# Patient Record
Sex: Male | Born: 1990 | Race: Black or African American | Hispanic: No | Marital: Single | Smoking: Current every day smoker
Health system: Southern US, Community
[De-identification: ages and names within clinical notes are randomized; demographics above are authoritative.]

## PROBLEM LIST (undated history)

## (undated) ENCOUNTER — Emergency Department (HOSPITAL_COMMUNITY): Payer: No Typology Code available for payment source | Source: Home / Self Care

## (undated) DIAGNOSIS — J302 Other seasonal allergic rhinitis: Secondary | ICD-10-CM

## (undated) DIAGNOSIS — W3400XA Accidental discharge from unspecified firearms or gun, initial encounter: Secondary | ICD-10-CM

## (undated) DIAGNOSIS — Z789 Other specified health status: Secondary | ICD-10-CM

## (undated) HISTORY — PX: EXPLORATORY LAPAROTOMY: SUR591

## (undated) HISTORY — PX: ABDOMINAL SURGERY: SHX537

---
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
## 2012-10-29 ENCOUNTER — Emergency Department (HOSPITAL_COMMUNITY): Payer: No Typology Code available for payment source

## 2012-10-29 ENCOUNTER — Encounter (HOSPITAL_COMMUNITY): Payer: Self-pay

## 2012-10-29 ENCOUNTER — Emergency Department (HOSPITAL_COMMUNITY)
Admission: EM | Admit: 2012-10-29 | Discharge: 2012-10-29 | Disposition: A | Discharge status: Home / Self Care | Payer: No Typology Code available for payment source | Attending: Emergency Medicine | Admitting: Emergency Medicine

## 2012-10-29 DIAGNOSIS — Y999 Unspecified external cause status: Secondary | ICD-10-CM | POA: Insufficient documentation

## 2012-10-29 DIAGNOSIS — Y9389 Activity, other specified: Secondary | ICD-10-CM | POA: Insufficient documentation

## 2012-10-29 DIAGNOSIS — S298XXA Other specified injuries of thorax, initial encounter: Secondary | ICD-10-CM | POA: Insufficient documentation

## 2012-10-29 DIAGNOSIS — Z8709 Personal history of other diseases of the respiratory system: Secondary | ICD-10-CM | POA: Insufficient documentation

## 2012-10-29 DIAGNOSIS — Y92410 Unspecified street and highway as the place of occurrence of the external cause: Secondary | ICD-10-CM | POA: Insufficient documentation

## 2012-10-29 DIAGNOSIS — S43014A Anterior dislocation of right humerus, initial encounter: Secondary | ICD-10-CM

## 2012-10-29 DIAGNOSIS — S060X9A Concussion with loss of consciousness of unspecified duration, initial encounter: Secondary | ICD-10-CM

## 2012-10-29 DIAGNOSIS — S43016A Anterior dislocation of unspecified humerus, initial encounter: Secondary | ICD-10-CM | POA: Insufficient documentation

## 2012-10-29 DIAGNOSIS — S0993XA Unspecified injury of face, initial encounter: Secondary | ICD-10-CM | POA: Insufficient documentation

## 2012-10-29 DIAGNOSIS — R404 Transient alteration of awareness: Secondary | ICD-10-CM | POA: Insufficient documentation

## 2012-10-29 HISTORY — DX: Other seasonal allergic rhinitis: J30.2

## 2012-10-29 MED ORDER — HYDROCODONE-ACETAMINOPHEN 5-325 MG PO TABS: 1.0000 | ORAL_TABLET | Freq: Four times a day (QID) | ORAL | Status: DC | PRN

## 2012-10-29 MED ORDER — CYCLOBENZAPRINE HCL 10 MG PO TABS: 10.0000 mg | ORAL_TABLET | Freq: Two times a day (BID) | ORAL | Status: DC | PRN

## 2012-10-29 MED ORDER — IBUPROFEN 600 MG PO TABS: 600.0000 mg | ORAL_TABLET | Freq: Four times a day (QID) | ORAL | Status: DC | PRN

## 2012-10-29 MED ORDER — KETOROLAC TROMETHAMINE 60 MG/2ML IM SOLN
60.0000 mg | Freq: Once | INTRAMUSCULAR | Status: AC
  Administered 2012-10-29: 60 mg via INTRAMUSCULAR
  Filled 2012-10-29: qty 2
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
## 2012-10-29 NOTE — ED Notes (Signed)
  Pt very sleepy and fell asleep during Triage

## 2013-04-13 ENCOUNTER — Emergency Department (HOSPITAL_COMMUNITY): Payer: No Typology Code available for payment source

## 2013-04-13 ENCOUNTER — Encounter (HOSPITAL_COMMUNITY): Payer: Self-pay | Admitting: Emergency Medicine

## 2013-04-13 ENCOUNTER — Emergency Department (HOSPITAL_COMMUNITY)
Admission: EM | Admit: 2013-04-13 | Discharge: 2013-04-13 | Disposition: A | Discharge status: Home / Self Care | Payer: No Typology Code available for payment source | Attending: Emergency Medicine | Admitting: Emergency Medicine

## 2013-04-13 DIAGNOSIS — Y92410 Unspecified street and highway as the place of occurrence of the external cause: Secondary | ICD-10-CM | POA: Insufficient documentation

## 2013-04-13 DIAGNOSIS — S40012S Contusion of left shoulder, sequela: Secondary | ICD-10-CM

## 2013-04-13 DIAGNOSIS — S0993XA Unspecified injury of face, initial encounter: Secondary | ICD-10-CM | POA: Insufficient documentation

## 2013-04-13 DIAGNOSIS — IMO0002 Reserved for concepts with insufficient information to code with codable children: Secondary | ICD-10-CM | POA: Insufficient documentation

## 2013-04-13 DIAGNOSIS — S40019A Contusion of unspecified shoulder, initial encounter: Secondary | ICD-10-CM

## 2013-04-13 DIAGNOSIS — S9002XA Contusion of left ankle, initial encounter: Secondary | ICD-10-CM

## 2013-04-13 DIAGNOSIS — Y9389 Activity, other specified: Secondary | ICD-10-CM | POA: Insufficient documentation

## 2013-04-13 DIAGNOSIS — S9000XA Contusion of unspecified ankle, initial encounter: Secondary | ICD-10-CM | POA: Insufficient documentation

## 2013-04-13 LAB — CBC WITH DIFFERENTIAL/PLATELET
Basophils Absolute: 0 10*3/uL (ref 0.0–0.1)
Basophils Relative: 0 % (ref 0–1)
Eosinophils Absolute: 0 10*3/uL (ref 0.0–0.7)
MCH: 32.3 pg (ref 26.0–34.0)
MCHC: 35.5 g/dL (ref 30.0–36.0)
Neutro Abs: 4 10*3/uL (ref 1.7–7.7)
Neutrophils Relative %: 60 % (ref 43–77)
Platelets: 248 10*3/uL (ref 150–400)
RDW: 14.4 % (ref 11.5–15.5)

## 2013-04-13 LAB — URINALYSIS W MICROSCOPIC + REFLEX CULTURE
Glucose, UA: NEGATIVE mg/dL
Hgb urine dipstick: NEGATIVE
Ketones, ur: 15 mg/dL — AB
Protein, ur: NEGATIVE mg/dL
pH: 6 (ref 5.0–8.0)

## 2013-04-13 LAB — COMPREHENSIVE METABOLIC PANEL
AST: 21 U/L (ref 0–37)
Albumin: 4 g/dL (ref 3.5–5.2)
Alkaline Phosphatase: 59 U/L (ref 39–117)
Chloride: 102 mEq/L (ref 96–112)
Potassium: 3.5 mEq/L (ref 3.5–5.1)
Total Bilirubin: 0.3 mg/dL (ref 0.3–1.2)
Total Protein: 6.9 g/dL (ref 6.0–8.3)

## 2013-04-13 LAB — RAPID URINE DRUG SCREEN, HOSP PERFORMED
Cocaine: POSITIVE — AB
Opiates: NOT DETECTED
Tetrahydrocannabinol: POSITIVE — AB

## 2013-04-13 MED ORDER — HYDROMORPHONE HCL PF 1 MG/ML IJ SOLN
1.0000 mg | Freq: Once | INTRAMUSCULAR | Status: AC
  Administered 2013-04-13: 1 mg via INTRAVENOUS
  Filled 2013-04-13: qty 1

## 2013-04-13 MED ORDER — OXYCODONE-ACETAMINOPHEN 5-325 MG PO TABS: 1.0000 | ORAL_TABLET | Freq: Four times a day (QID) | ORAL | Status: DC | PRN

## 2013-04-13 MED ORDER — HYDROMORPHONE HCL PF 1 MG/ML IJ SOLN
0.5000 mg | Freq: Once | INTRAMUSCULAR | Status: AC
  Administered 2013-04-13: 0.5 mg via INTRAVENOUS
  Filled 2013-04-13: qty 1
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
## 2013-04-13 NOTE — ED Notes (Signed)
Pt returned from CT °
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
## 2013-04-13 NOTE — ED Notes (Signed)
Pt attempting to provide urine sample

## 2013-04-14 LAB — URINE CULTURE
Colony Count: NO GROWTH
Culture: NO GROWTH

## 2013-05-12 ENCOUNTER — Ambulatory Visit: Payer: Self-pay

## 2013-08-04 ENCOUNTER — Encounter (HOSPITAL_COMMUNITY): Payer: Self-pay | Admitting: Emergency Medicine

## 2013-08-04 ENCOUNTER — Emergency Department (HOSPITAL_COMMUNITY)
Admission: EM | Admit: 2013-08-04 | Discharge: 2013-08-04 | Disposition: A | Discharge status: Home / Self Care | Payer: No Typology Code available for payment source | Attending: Emergency Medicine | Admitting: Emergency Medicine

## 2013-08-04 ENCOUNTER — Emergency Department (HOSPITAL_COMMUNITY): Payer: No Typology Code available for payment source

## 2013-08-04 DIAGNOSIS — F17200 Nicotine dependence, unspecified, uncomplicated: Secondary | ICD-10-CM | POA: Insufficient documentation

## 2013-08-04 DIAGNOSIS — R296 Repeated falls: Secondary | ICD-10-CM | POA: Insufficient documentation

## 2013-08-04 DIAGNOSIS — T148XXA Other injury of unspecified body region, initial encounter: Secondary | ICD-10-CM

## 2013-08-04 DIAGNOSIS — Y929 Unspecified place or not applicable: Secondary | ICD-10-CM | POA: Insufficient documentation

## 2013-08-04 DIAGNOSIS — M25461 Effusion, right knee: Secondary | ICD-10-CM

## 2013-08-04 DIAGNOSIS — IMO0002 Reserved for concepts with insufficient information to code with codable children: Secondary | ICD-10-CM | POA: Insufficient documentation

## 2013-08-04 DIAGNOSIS — Y939 Activity, unspecified: Secondary | ICD-10-CM | POA: Insufficient documentation

## 2013-08-04 MED ORDER — OXYCODONE-ACETAMINOPHEN 5-325 MG PO TABS
1.0000 | ORAL_TABLET | Freq: Once | ORAL | Status: AC
  Administered 2013-08-04: 1 via ORAL
  Filled 2013-08-04: qty 1

## 2013-08-04 MED ORDER — MELOXICAM 7.5 MG PO TABS: 7.5000 mg | ORAL_TABLET | Freq: Every day | ORAL | Status: DC

## 2013-08-04 MED ORDER — HYDROCODONE-ACETAMINOPHEN 7.5-325 MG/15ML PO SOLN: 10.0000 mL | Freq: Four times a day (QID) | ORAL | Status: AC | PRN

## 2013-08-04 MED ORDER — KETOROLAC TROMETHAMINE 60 MG/2ML IM SOLN
60.0000 mg | Freq: Once | INTRAMUSCULAR | Status: AC
  Administered 2013-08-04: 60 mg via INTRAMUSCULAR
  Filled 2013-08-04: qty 2

## 2013-08-04 NOTE — Discharge Instructions (Signed)
Abrasions  An abrasion is a cut or scrape of the skin. Abrasions do not go through all layers of the skin.  HOME CARE  · If a bandage (dressing) was put on your wound, change it as told by your doctor. If the bandage sticks, soak it off with warm.  · Wash the area with water and soap 2 times a day. Rinse off the soap. Pat the area dry with a clean towel.  · Put on medicated cream (ointment) as told by your doctor.  · Change your bandage right away if it gets wet or dirty.  · Only take medicine as told by your doctor.  · See your doctor within 24 48 hours to get your wound checked.  · Check your wound for redness, puffiness (swelling), or yellowish-white fluid (pus).  GET HELP RIGHT AWAY IF:   · You have more pain in the wound.  · You have redness, swelling, or tenderness around the wound.  · You have pus coming from the wound.  · You have a fever or lasting symptoms for more than 2 3 days.  · You have a fever and your symptoms suddenly get worse.  · You have a bad smell coming from the wound or bandage.  MAKE SURE YOU:   · Understand these instructions.  · Will watch your condition.  · Will get help right away if you are not doing well or get worse.  Document Released: 10/18/2007 Document Revised: 01/24/2012 Document Reviewed: 04/04/2011  [employer]® Patient Information ©2014 [employer].

## 2013-08-04 NOTE — ED Notes (Signed)
Pt is being discharged via wheel chair. Pt has a friend who will drive him home. VS stable.
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
## 2013-10-29 ENCOUNTER — Emergency Department (HOSPITAL_COMMUNITY)
Admission: EM | Admit: 2013-10-29 | Discharge: 2013-10-30 | Disposition: A | Discharge status: Home / Self Care | Payer: No Typology Code available for payment source | Attending: Emergency Medicine | Admitting: Emergency Medicine

## 2013-10-29 ENCOUNTER — Encounter (HOSPITAL_COMMUNITY): Payer: Self-pay | Admitting: Emergency Medicine

## 2013-10-29 DIAGNOSIS — R112 Nausea with vomiting, unspecified: Secondary | ICD-10-CM | POA: Insufficient documentation

## 2013-10-29 DIAGNOSIS — F17200 Nicotine dependence, unspecified, uncomplicated: Secondary | ICD-10-CM | POA: Insufficient documentation

## 2013-10-29 DIAGNOSIS — R51 Headache: Secondary | ICD-10-CM | POA: Insufficient documentation

## 2013-10-29 DIAGNOSIS — Z791 Long term (current) use of non-steroidal anti-inflammatories (NSAID): Secondary | ICD-10-CM | POA: Insufficient documentation

## 2013-10-29 DIAGNOSIS — R519 Headache, unspecified: Secondary | ICD-10-CM

## 2013-10-29 DIAGNOSIS — R509 Fever, unspecified: Secondary | ICD-10-CM | POA: Insufficient documentation

## 2013-10-29 HISTORY — DX: Accidental discharge from unspecified firearms or gun, initial encounter: W34.00XA

## 2013-10-29 LAB — CBC WITH DIFFERENTIAL/PLATELET
Basophils Absolute: 0 10*3/uL (ref 0.0–0.1)
Basophils Relative: 1 % (ref 0–1)
EOS ABS: 0.1 10*3/uL (ref 0.0–0.7)
Eosinophils Relative: 1 % (ref 0–5)
HCT: 42.6 % (ref 39.0–52.0)
HEMOGLOBIN: 14.7 g/dL (ref 13.0–17.0)
LYMPHS PCT: 28 % (ref 12–46)
Lymphs Abs: 2 10*3/uL (ref 0.7–4.0)
MCH: 32.1 pg (ref 26.0–34.0)
MCHC: 34.5 g/dL (ref 30.0–36.0)
MCV: 93 fL (ref 78.0–100.0)
MONOS PCT: 11 % (ref 3–12)
Monocytes Absolute: 0.8 10*3/uL (ref 0.1–1.0)
NEUTROS PCT: 59 % (ref 43–77)
Neutro Abs: 4.2 10*3/uL (ref 1.7–7.7)
Platelets: 273 10*3/uL (ref 150–400)
RBC: 4.58 MIL/uL (ref 4.22–5.81)
RDW: 14 % (ref 11.5–15.5)
WBC: 7.1 10*3/uL (ref 4.0–10.5)

## 2013-10-29 LAB — COMPREHENSIVE METABOLIC PANEL
ALK PHOS: 74 U/L (ref 39–117)
ALT: 7 U/L (ref 0–53)
AST: 23 U/L (ref 0–37)
Albumin: 3.8 g/dL (ref 3.5–5.2)
BILIRUBIN TOTAL: 0.3 mg/dL (ref 0.3–1.2)
BUN: 8 mg/dL (ref 6–23)
CALCIUM: 9.4 mg/dL (ref 8.4–10.5)
CO2: 28 meq/L (ref 19–32)
Chloride: 99 mEq/L (ref 96–112)
Creatinine, Ser: 0.99 mg/dL (ref 0.50–1.35)
GLUCOSE: 109 mg/dL — AB (ref 70–99)
Potassium: 4.2 mEq/L (ref 3.7–5.3)
Sodium: 138 mEq/L (ref 137–147)
Total Protein: 7.2 g/dL (ref 6.0–8.3)

## 2013-10-29 LAB — I-STAT CG4 LACTIC ACID, ED: Lactic Acid, Venous: 0.87 mmol/L (ref 0.5–2.2)

## 2013-10-29 MED ORDER — PROMETHAZINE HCL 25 MG PO TABS: 25.0000 mg | ORAL_TABLET | Freq: Four times a day (QID) | ORAL | Status: AC | PRN

## 2013-10-29 MED ORDER — ONDANSETRON HCL 4 MG/2ML IJ SOLN
4.0000 mg | Freq: Once | INTRAMUSCULAR | Status: AC
Start: 2013-10-29 — End: 2013-10-29
  Administered 2013-10-29: 4 mg via INTRAVENOUS
  Filled 2013-10-29: qty 2

## 2013-10-29 MED ORDER — SODIUM CHLORIDE 0.9 % IV BOLUS (SEPSIS)
1000.0000 mL | Freq: Once | INTRAVENOUS | Status: AC
  Administered 2013-10-29: 1000 mL via INTRAVENOUS

## 2013-10-29 MED ORDER — MORPHINE SULFATE 4 MG/ML IJ SOLN
4.0000 mg | Freq: Once | INTRAMUSCULAR | Status: AC
  Administered 2013-10-29: 4 mg via INTRAVENOUS
  Filled 2013-10-29: qty 1

## 2013-10-29 MED ORDER — KETOROLAC TROMETHAMINE 30 MG/ML IJ SOLN
30.0000 mg | Freq: Once | INTRAMUSCULAR | Status: AC
Start: 2013-10-29 — End: 2013-10-29
  Administered 2013-10-29: 30 mg via INTRAVENOUS
  Filled 2013-10-29: qty 1

## 2013-10-29 NOTE — ED Notes (Signed)
Pt c/o frontal headache onset last week worse since yesterday, +n/v.  No relief with ibuprofen/APAP. Denies nuchal rigidity

## 2013-10-29 NOTE — ED Notes (Signed)
Pt noted to be falling asleep between questions in triage. Pt denies any intake that would cause drowsiness.
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
## 2013-10-29 NOTE — Discharge Instructions (Signed)
General Headache Without Cause A headache is pain or discomfort felt around the head or neck area. The specific cause of a headache may not be found. There are many causes and types of headaches. A few common ones are:  Tension headaches.  Migraine headaches.  Cluster headaches.  Chronic daily headaches. HOME CARE INSTRUCTIONS   Keep all follow-up appointments with your caregiver or any specialist referral.  Only take over-the-counter or prescription medicines for pain or discomfort as directed by your caregiver.  Lie down in a dark, quiet room when you have a headache.  Keep a headache journal to find out what may trigger your migraine headaches. For example, write down:  What you eat and drink.  How much sleep you get.  Any change to your diet or medicines.  Try massage or other relaxation techniques.  Put ice packs or heat on the head and neck. Use these 3 to 4 times per day for 15 to 20 minutes each time, or as needed.  Limit stress.  Sit up straight, and do not tense your muscles.  Quit smoking if you smoke.  Limit alcohol use.  Decrease the amount of caffeine you drink, or stop drinking caffeine.  Eat and sleep on a regular schedule.  Get 7 to 9 hours of sleep, or as recommended by your caregiver.  Keep lights dim if bright lights bother you and make your headaches worse. SEEK MEDICAL CARE IF:   You have problems with the medicines you were prescribed.  Your medicines are not working.  You have a change from the usual headache.  You have nausea or vomiting. SEEK IMMEDIATE MEDICAL CARE IF:   Your headache becomes severe.  You have a fever.  You have a stiff neck.  You have loss of vision.  You have muscular weakness or loss of muscle control.  You start losing your balance or have trouble walking.  You feel faint or pass out.  You have severe symptoms that are different from your first symptoms. MAKE SURE YOU:   Understand these  instructions.  Will watch your condition.  Will get help right away if you are not doing well or get worse. Document Released: 05/01/2005 Document Revised: 07/24/2011 Document Reviewed: 05/17/2011 [facility] Patient Information 2015 [place], [place]. This information is not intended to replace advice given to you by your health care provider. Make sure you discuss any questions you have with your health care provider. Viral Infections A viral infection can be caused by different types of viruses.Most viral infections are not serious and resolve on their own. However, some infections may cause severe symptoms and may lead to further complications. SYMPTOMS Viruses can frequently cause:  Minor sore throat.  Aches and pains.  Headaches.  Runny nose.  Different types of rashes.  Watery eyes.  Tiredness.  Cough.  Loss of appetite.  Gastrointestinal infections, resulting in nausea, vomiting, and diarrhea. These symptoms do not respond to antibiotics because the infection is not caused by bacteria. However, you might catch a bacterial infection following the viral infection. This is sometimes called a "superinfection." Symptoms of such a bacterial infection may include:  Worsening sore throat with pus and difficulty swallowing.  Swollen neck glands.  Chills and a high or persistent fever.  Severe headache.  Tenderness over the sinuses.  Persistent overall ill feeling (malaise), muscle aches, and tiredness (fatigue).  Persistent cough.  Yellow, green, or brown mucus production with coughing. HOME CARE INSTRUCTIONS   Only take over-the-counter or prescription  medicines for pain, discomfort, diarrhea, or fever as directed by your caregiver.  Drink enough water and fluids to keep your urine clear or pale yellow. Sports drinks can provide valuable electrolytes, sugars, and hydration.  Get plenty of rest and maintain proper nutrition. Soups and broths with crackers or rice are  fine. SEEK IMMEDIATE MEDICAL CARE IF:   You have severe headaches, shortness of breath, chest pain, neck pain, or an unusual rash.  You have uncontrolled vomiting, diarrhea, or you are unable to keep down fluids.  You or your child has an oral temperature above 102 F (38.9 C), not controlled by medicine.  Your baby is older than 3 months with a rectal temperature of 102 F (38.9 C) or higher.  Your baby is 253 months old or younger with a rectal temperature of 100.4 F (38 C) or higher. MAKE SURE YOU:   Understand these instructions.  Will watch your condition.  Will get help right away if you are not doing well or get worse. Document Released: 02/08/2005 Document Revised: 07/24/2011 Document Reviewed: 09/05/2010 [facility] Patient Information 2015 [place], [place]. This information is not intended to replace advice given to you by your health care provider. Make sure you discuss any questions you have with your health care provider.

## 2015-04-05 ENCOUNTER — Inpatient Hospital Stay (HOSPITAL_COMMUNITY)
Admission: EM | Admit: 2015-04-05 | Discharge: 2015-04-13 | DRG: 956 | Disposition: A | Discharge status: Home Health | Payer: Self-pay | Attending: General Surgery | Admitting: General Surgery

## 2015-04-05 ENCOUNTER — Emergency Department (HOSPITAL_COMMUNITY): Payer: MEDICAID

## 2015-04-05 ENCOUNTER — Emergency Department (HOSPITAL_COMMUNITY): Payer: Self-pay

## 2015-04-05 ENCOUNTER — Emergency Department (HOSPITAL_COMMUNITY): Payer: Self-pay | Admitting: Certified Registered Nurse Anesthetist

## 2015-04-05 ENCOUNTER — Encounter (HOSPITAL_COMMUNITY): Admission: EM | Disposition: A | Discharge status: Home Health | Payer: Self-pay | Source: Home / Self Care

## 2015-04-05 ENCOUNTER — Emergency Department (HOSPITAL_COMMUNITY): Payer: MEDICAID | Admitting: Certified Registered Nurse Anesthetist

## 2015-04-05 DIAGNOSIS — S7291XB Unspecified fracture of right femur, initial encounter for open fracture type I or II: Secondary | ICD-10-CM | POA: Diagnosis present

## 2015-04-05 DIAGNOSIS — S20359A Superficial foreign body of unspecified front wall of thorax, initial encounter: Secondary | ICD-10-CM | POA: Diagnosis present

## 2015-04-05 DIAGNOSIS — T148XXA Other injury of unspecified body region, initial encounter: Secondary | ICD-10-CM

## 2015-04-05 DIAGNOSIS — S7291XA Unspecified fracture of right femur, initial encounter for closed fracture: Secondary | ICD-10-CM | POA: Diagnosis present

## 2015-04-05 DIAGNOSIS — S27321A Contusion of lung, unilateral, initial encounter: Secondary | ICD-10-CM | POA: Diagnosis present

## 2015-04-05 DIAGNOSIS — R339 Retention of urine, unspecified: Secondary | ICD-10-CM | POA: Diagnosis not present

## 2015-04-05 DIAGNOSIS — T1490XA Injury, unspecified, initial encounter: Secondary | ICD-10-CM

## 2015-04-05 DIAGNOSIS — W3400XA Accidental discharge from unspecified firearms or gun, initial encounter: Secondary | ICD-10-CM

## 2015-04-05 DIAGNOSIS — I959 Hypotension, unspecified: Secondary | ICD-10-CM | POA: Diagnosis present

## 2015-04-05 DIAGNOSIS — Z938 Other artificial opening status: Secondary | ICD-10-CM

## 2015-04-05 DIAGNOSIS — S45111A Laceration of brachial artery, right side, initial encounter: Secondary | ICD-10-CM | POA: Diagnosis present

## 2015-04-05 DIAGNOSIS — F17200 Nicotine dependence, unspecified, uncomplicated: Secondary | ICD-10-CM | POA: Diagnosis present

## 2015-04-05 DIAGNOSIS — S42301B Unspecified fracture of shaft of humerus, right arm, initial encounter for open fracture: Secondary | ICD-10-CM | POA: Diagnosis present

## 2015-04-05 DIAGNOSIS — Z4659 Encounter for fitting and adjustment of other gastrointestinal appliance and device: Secondary | ICD-10-CM

## 2015-04-05 DIAGNOSIS — Z23 Encounter for immunization: Secondary | ICD-10-CM

## 2015-04-05 DIAGNOSIS — S45191A Other specified injury of brachial artery, right side, initial encounter: Secondary | ICD-10-CM

## 2015-04-05 DIAGNOSIS — T07XXXA Unspecified multiple injuries, initial encounter: Secondary | ICD-10-CM

## 2015-04-05 DIAGNOSIS — S4421XA Injury of radial nerve at upper arm level, right arm, initial encounter: Secondary | ICD-10-CM | POA: Diagnosis present

## 2015-04-05 DIAGNOSIS — Z9689 Presence of other specified functional implants: Secondary | ICD-10-CM

## 2015-04-05 DIAGNOSIS — S42301A Unspecified fracture of shaft of humerus, right arm, initial encounter for closed fracture: Secondary | ICD-10-CM | POA: Diagnosis present

## 2015-04-05 DIAGNOSIS — J9600 Acute respiratory failure, unspecified whether with hypoxia or hypercapnia: Secondary | ICD-10-CM | POA: Diagnosis present

## 2015-04-05 DIAGNOSIS — D62 Acute posthemorrhagic anemia: Secondary | ICD-10-CM | POA: Diagnosis present

## 2015-04-05 DIAGNOSIS — J9382 Other air leak: Secondary | ICD-10-CM | POA: Diagnosis not present

## 2015-04-05 DIAGNOSIS — S2241XA Multiple fractures of ribs, right side, initial encounter for closed fracture: Secondary | ICD-10-CM | POA: Diagnosis present

## 2015-04-05 DIAGNOSIS — S272XXA Traumatic hemopneumothorax, initial encounter: Principal | ICD-10-CM | POA: Diagnosis present

## 2015-04-05 DIAGNOSIS — S2249XA Multiple fractures of ribs, unspecified side, initial encounter for closed fracture: Secondary | ICD-10-CM | POA: Diagnosis present

## 2015-04-05 DIAGNOSIS — S5411XA Injury of median nerve at forearm level, right arm, initial encounter: Secondary | ICD-10-CM | POA: Diagnosis present

## 2015-04-05 DIAGNOSIS — J939 Pneumothorax, unspecified: Secondary | ICD-10-CM

## 2015-04-05 DIAGNOSIS — S5421XA Injury of radial nerve at forearm level, right arm, initial encounter: Secondary | ICD-10-CM | POA: Diagnosis present

## 2015-04-05 DIAGNOSIS — S45101A Unspecified injury of brachial artery, right side, initial encounter: Secondary | ICD-10-CM | POA: Diagnosis present

## 2015-04-05 DIAGNOSIS — J9690 Respiratory failure, unspecified, unspecified whether with hypoxia or hypercapnia: Secondary | ICD-10-CM

## 2015-04-05 DIAGNOSIS — J9601 Acute respiratory failure with hypoxia: Secondary | ICD-10-CM | POA: Diagnosis not present

## 2015-04-05 DIAGNOSIS — Z789 Other specified health status: Secondary | ICD-10-CM

## 2015-04-05 HISTORY — PX: WOUND EXPLORATION: SHX6188

## 2015-04-05 HISTORY — DX: Other specified health status: Z78.9

## 2015-04-05 HISTORY — PX: FEMUR IM NAIL: SHX1597

## 2015-04-05 LAB — COMPREHENSIVE METABOLIC PANEL
ALBUMIN: 3.9 g/dL (ref 3.5–5.0)
ALK PHOS: 60 U/L (ref 38–126)
ALT: 12 U/L — ABNORMAL LOW (ref 17–63)
AST: 34 U/L (ref 15–41)
Anion gap: 13 (ref 5–15)
BILIRUBIN TOTAL: 0.7 mg/dL (ref 0.3–1.2)
BUN: 13 mg/dL (ref 6–20)
CALCIUM: 9.1 mg/dL (ref 8.9–10.3)
CO2: 20 mmol/L — ABNORMAL LOW (ref 22–32)
Chloride: 105 mmol/L (ref 101–111)
Creatinine, Ser: 1.4 mg/dL — ABNORMAL HIGH (ref 0.61–1.24)
GFR calc Af Amer: >60 mL/min (ref >60)
GLUCOSE: 120 mg/dL — AB (ref 65–99)
Potassium: 3.7 mmol/L (ref 3.5–5.1)
Sodium: 138 mmol/L (ref 135–145)
TOTAL PROTEIN: 6.5 g/dL (ref 6.5–8.1)

## 2015-04-05 LAB — CBC
HCT: 39.6 % (ref 39.0–52.0)
Hemoglobin: 13.6 g/dL (ref 13.0–17.0)
MCH: 31.6 pg (ref 26.0–34.0)
MCHC: 34.3 g/dL (ref 30.0–36.0)
MCV: 92.1 fL (ref 78.0–100.0)
PLATELETS: 238 10*3/uL (ref 150–400)
RBC: 4.3 MIL/uL (ref 4.22–5.81)
RDW: 14.2 % (ref 11.5–15.5)
WBC: 9.7 10*3/uL (ref 4.0–10.5)

## 2015-04-05 LAB — PREPARE FRESH FROZEN PLASMA
Unit division: 0
Unit division: 0

## 2015-04-05 LAB — ETHANOL

## 2015-04-05 LAB — PROTIME-INR
INR: 1.12 (ref 0.00–1.49)
PROTHROMBIN TIME: 14.6 s (ref 11.6–15.2)

## 2015-04-05 LAB — CDS SEROLOGY

## 2015-04-05 LAB — ABO/RH: ABO/RH(D): O POS

## 2015-04-05 SURGERY — WOUND EXPLORATION
Anesthesia: General | Laterality: Right

## 2015-04-05 MED ORDER — FENTANYL CITRATE (PF) 250 MCG/5ML IJ SOLN
INTRAMUSCULAR | Status: AC
  Filled 2015-04-05: qty 5

## 2015-04-05 MED ORDER — MIDAZOLAM HCL 5 MG/5ML IJ SOLN
INTRAMUSCULAR | Status: DC | PRN
  Administered 2015-04-05: 2 mg via INTRAVENOUS

## 2015-04-05 MED ORDER — LIDOCAINE HCL (PF) 1 % IJ SOLN
INTRAMUSCULAR | Status: AC
  Filled 2015-04-05: qty 30

## 2015-04-05 MED ORDER — ALBUMIN HUMAN 5 % IV SOLN
INTRAVENOUS | Status: DC | PRN
  Administered 2015-04-05: 23:00:00 via INTRAVENOUS

## 2015-04-05 MED ORDER — PHENYLEPHRINE 40 MCG/ML (10ML) SYRINGE FOR IV PUSH (FOR BLOOD PRESSURE SUPPORT)
PREFILLED_SYRINGE | INTRAVENOUS | Status: AC
  Filled 2015-04-05: qty 30

## 2015-04-05 MED ORDER — PHENYLEPHRINE HCL 10 MG/ML IJ SOLN
10.0000 mg | INTRAVENOUS | Status: DC | PRN
  Administered 2015-04-05: 30 ug/min via INTRAVENOUS

## 2015-04-05 MED ORDER — 0.9 % SODIUM CHLORIDE (POUR BTL) OPTIME
TOPICAL | Status: DC | PRN
  Administered 2015-04-05: 1000 mL

## 2015-04-05 MED ORDER — SUCCINYLCHOLINE CHLORIDE 20 MG/ML IJ SOLN
INTRAMUSCULAR | Status: AC
  Filled 2015-04-05: qty 1

## 2015-04-05 MED ORDER — ROCURONIUM BROMIDE 100 MG/10ML IV SOLN
INTRAVENOUS | Status: DC | PRN
  Administered 2015-04-05: 30 mg via INTRAVENOUS
  Administered 2015-04-05 (×2): 25 mg via INTRAVENOUS
  Administered 2015-04-05: 15 mg via INTRAVENOUS
  Administered 2015-04-05: 35 mg via INTRAVENOUS
  Administered 2015-04-06: 20 mg via INTRAVENOUS

## 2015-04-05 MED ORDER — DEXTROSE 50 % IV SOLN
INTRAVENOUS | Status: AC
  Filled 2015-04-05: qty 50

## 2015-04-05 MED ORDER — THROMBIN 20000 UNITS EX SOLR
CUTANEOUS | Status: AC
  Filled 2015-04-05: qty 20000

## 2015-04-05 MED ORDER — FENTANYL CITRATE (PF) 100 MCG/2ML IJ SOLN
INTRAMUSCULAR | Status: AC | PRN
  Administered 2015-04-05: 50 ug via INTRAVENOUS

## 2015-04-05 MED ORDER — SODIUM CHLORIDE 0.9 % IJ SOLN
INTRAMUSCULAR | Status: DC | PRN
  Administered 2015-04-05: 60 mL

## 2015-04-05 MED ORDER — ONDANSETRON HCL 4 MG/2ML IJ SOLN
INTRAMUSCULAR | Status: AC
  Filled 2015-04-05: qty 2

## 2015-04-05 MED ORDER — CEFAZOLIN SODIUM-DEXTROSE 2-3 GM-% IV SOLR
INTRAVENOUS | Status: AC
  Filled 2015-04-05: qty 50

## 2015-04-05 MED ORDER — FENTANYL CITRATE (PF) 100 MCG/2ML IJ SOLN
INTRAMUSCULAR | Status: DC | PRN
  Administered 2015-04-05: 50 ug via INTRAVENOUS
  Administered 2015-04-05: 25 ug via INTRAVENOUS
  Administered 2015-04-05: 50 ug via INTRAVENOUS
  Administered 2015-04-05: 150 ug via INTRAVENOUS
  Administered 2015-04-05: 25 ug via INTRAVENOUS
  Administered 2015-04-06 (×4): 50 ug via INTRAVENOUS

## 2015-04-05 MED ORDER — TETANUS-DIPHTH-ACELL PERTUSSIS 5-2.5-18.5 LF-MCG/0.5 IM SUSP
INTRAMUSCULAR | Status: AC
  Administered 2015-04-05: 0.5 mL via INTRAMUSCULAR
  Filled 2015-04-05: qty 0.5

## 2015-04-05 MED ORDER — PHENYLEPHRINE HCL 10 MG/ML IJ SOLN
INTRAMUSCULAR | Status: DC | PRN
  Administered 2015-04-05: 120 ug via INTRAVENOUS
  Administered 2015-04-05: 80 ug via INTRAVENOUS
  Administered 2015-04-05: 40 ug via INTRAVENOUS
  Administered 2015-04-05 (×2): 80 ug via INTRAVENOUS
  Administered 2015-04-05: 120 ug via INTRAVENOUS
  Administered 2015-04-05: 80 ug via INTRAVENOUS

## 2015-04-05 MED ORDER — INSULIN ASPART 100 UNIT/ML ~~LOC~~ SOLN
5.0000 [IU] | Freq: Once | SUBCUTANEOUS | Status: AC
  Administered 2015-04-05: 5 [IU] via SUBCUTANEOUS
  Filled 2015-04-05: qty 0.05

## 2015-04-05 MED ORDER — ALBUMIN HUMAN 5 % IV SOLN: INTRAVENOUS | Status: DC | PRN

## 2015-04-05 MED ORDER — PAPAVERINE HCL 30 MG/ML IJ SOLN
INTRAMUSCULAR | Status: AC
  Filled 2015-04-05: qty 2

## 2015-04-05 MED ORDER — LIDOCAINE HCL (PF) 2 % IJ SOLN
INTRAMUSCULAR | Status: AC
  Administered 2015-04-05: 19:00:00
  Filled 2015-04-05: qty 2

## 2015-04-05 MED ORDER — ROCURONIUM BROMIDE 50 MG/5ML IV SOLN
INTRAVENOUS | Status: AC
  Filled 2015-04-05: qty 1

## 2015-04-05 MED ORDER — PROPOFOL 10 MG/ML IV BOLUS
INTRAVENOUS | Status: DC | PRN
  Administered 2015-04-05: 150 mg via INTRAVENOUS

## 2015-04-05 MED ORDER — IOHEXOL 350 MG/ML SOLN
100.0000 mL | Freq: Once | INTRAVENOUS | Status: AC | PRN
  Administered 2015-04-05: 100 mL via INTRAVENOUS

## 2015-04-05 MED ORDER — SODIUM CHLORIDE 0.9 % IV SOLN
INTRAVENOUS | Status: DC | PRN
  Administered 2015-04-05 – 2015-04-06 (×3): via INTRAVENOUS

## 2015-04-05 MED ORDER — SUCCINYLCHOLINE CHLORIDE 20 MG/ML IJ SOLN
INTRAMUSCULAR | Status: DC | PRN
  Administered 2015-04-05: 100 mg via INTRAVENOUS

## 2015-04-05 MED ORDER — THROMBIN 20000 UNITS EX SOLR
CUTANEOUS | Status: DC | PRN
  Administered 2015-04-05: 20 mL via TOPICAL

## 2015-04-05 MED ORDER — SODIUM CHLORIDE 0.9 % IV SOLN
INTRAVENOUS | Status: DC | PRN
  Administered 2015-04-05: 500 mL

## 2015-04-05 MED ORDER — SODIUM CHLORIDE 0.9 % IJ SOLN
INTRAMUSCULAR | Status: AC
  Filled 2015-04-05: qty 10

## 2015-04-05 MED ORDER — FENTANYL CITRATE (PF) 100 MCG/2ML IJ SOLN
INTRAMUSCULAR | Status: AC
  Filled 2015-04-05: qty 2

## 2015-04-05 MED ORDER — PHENYLEPHRINE 40 MCG/ML (10ML) SYRINGE FOR IV PUSH (FOR BLOOD PRESSURE SUPPORT)
PREFILLED_SYRINGE | INTRAVENOUS | Status: AC
  Filled 2015-04-05: qty 10

## 2015-04-05 MED ORDER — PROPOFOL 10 MG/ML IV BOLUS
INTRAVENOUS | Status: AC
  Filled 2015-04-05: qty 40

## 2015-04-05 MED ORDER — ARTIFICIAL TEARS OP OINT
TOPICAL_OINTMENT | OPHTHALMIC | Status: AC
  Filled 2015-04-05: qty 3.5

## 2015-04-05 MED ORDER — MIDAZOLAM HCL 2 MG/2ML IJ SOLN
INTRAMUSCULAR | Status: AC
  Filled 2015-04-05: qty 2

## 2015-04-05 MED ORDER — DEXTROSE 50 % IV SOLN
INTRAVENOUS | Status: DC | PRN
  Administered 2015-04-05: 12.5 g via INTRAVENOUS

## 2015-04-05 MED ORDER — CEFAZOLIN SODIUM-DEXTROSE 2-3 GM-% IV SOLR
INTRAVENOUS | Status: DC | PRN
  Administered 2015-04-05 – 2015-04-06 (×2): 2 g via INTRAVENOUS

## 2015-04-05 MED ORDER — SODIUM CHLORIDE 0.9 % IV SOLN
INTRAVENOUS | Status: AC | PRN
  Administered 2015-04-05: 3000 mL via INTRAVENOUS

## 2015-04-05 MED ORDER — EPHEDRINE SULFATE 50 MG/ML IJ SOLN
INTRAMUSCULAR | Status: AC
  Filled 2015-04-05: qty 1

## 2015-04-05 MED ORDER — LACTATED RINGERS IV SOLN
INTRAVENOUS | Status: DC | PRN
  Administered 2015-04-05 – 2015-04-06 (×6): via INTRAVENOUS

## 2015-04-05 MED ORDER — LIDOCAINE HCL (CARDIAC) 20 MG/ML IV SOLN
INTRAVENOUS | Status: AC
  Filled 2015-04-05: qty 5

## 2015-04-05 SURGICAL SUPPLY — 97 items
BANDAGE ELASTIC 4 VELCRO ST LF (GAUZE/BANDAGES/DRESSINGS) ×1 IMPLANT
BANDAGE ELASTIC 6 VELCRO ST LF (GAUZE/BANDAGES/DRESSINGS) ×1 IMPLANT
BANDAGE ESMARK 6X9 LF (GAUZE/BANDAGES/DRESSINGS) IMPLANT
BIT DRILL LONG 4.0 (BIT) IMPLANT
BIT DRILL SHORT 4.0 (BIT) IMPLANT
BLADE SURG 15 STRL LF DISP TIS (BLADE) IMPLANT
BLADE SURG 15 STRL SS (BLADE) ×3
BNDG CMPR 9X6 STRL LF SNTH (GAUZE/BANDAGES/DRESSINGS)
BNDG COHESIVE 4X5 TAN STRL (GAUZE/BANDAGES/DRESSINGS) ×3 IMPLANT
BNDG ESMARK 6X9 LF (GAUZE/BANDAGES/DRESSINGS)
BNDG GAUZE ELAST 4 BULKY (GAUZE/BANDAGES/DRESSINGS) ×3 IMPLANT
BRUSH SCRUB DISP (MISCELLANEOUS) ×2 IMPLANT
CANISTER SUCTION 2500CC (MISCELLANEOUS) ×5 IMPLANT
CANNULA VESSEL 3MM 2 BLNT TIP (CANNULA) ×2 IMPLANT
CONT SPEC STER OR (MISCELLANEOUS) ×4 IMPLANT
COVER MAYO STAND STRL (DRAPES) ×3 IMPLANT
COVER PROBE W GEL 5X96 (DRAPES) ×2 IMPLANT
COVER SURGICAL LIGHT HANDLE (MISCELLANEOUS) ×9 IMPLANT
CUFF TOURNIQUET SINGLE 18IN (TOURNIQUET CUFF) ×2 IMPLANT
DRAPE C-ARM 42X72 X-RAY (DRAPES) ×3 IMPLANT
DRAPE C-ARMOR (DRAPES) ×3 IMPLANT
DRAPE IMP U-DRAPE 54X76 (DRAPES) ×3 IMPLANT
DRAPE INCISE IOBAN 66X45 STRL (DRAPES) ×5 IMPLANT
DRAPE INCISE IOBAN 85X60 (DRAPES) ×2 IMPLANT
DRAPE ORTHO SPLIT 77X108 STRL (DRAPES) ×6
DRAPE SURG ORHT 6 SPLT 77X108 (DRAPES) ×2 IMPLANT
DRAPE U-SHAPE 47X51 STRL (DRAPES) ×3 IMPLANT
DRILL BIT LONG 4.0 (BIT) ×3
DRILL BIT SHORT 4.0 (BIT) ×6
DRSG MEPILEX BORDER 4X4 (GAUZE/BANDAGES/DRESSINGS) ×10 IMPLANT
DRSG MEPILEX BORDER 4X8 (GAUZE/BANDAGES/DRESSINGS) ×2 IMPLANT
ELECT REM PT RETURN 9FT ADLT (ELECTROSURGICAL) ×6
ELECTRODE REM PT RTRN 9FT ADLT (ELECTROSURGICAL) ×2 IMPLANT
EVACUATOR 1/8 PVC DRAIN (DRAIN) IMPLANT
GAUZE SPONGE 4X4 12PLY STRL (GAUZE/BANDAGES/DRESSINGS) ×3 IMPLANT
GAUZE SPONGE 4X4 16PLY XRAY LF (GAUZE/BANDAGES/DRESSINGS) ×2 IMPLANT
GAUZE XEROFORM 1X8 LF (GAUZE/BANDAGES/DRESSINGS) ×3 IMPLANT
GLOVE BIO SURGEON STRL SZ 6.5 (GLOVE) ×3 IMPLANT
GLOVE BIO SURGEON STRL SZ7.5 (GLOVE) ×3 IMPLANT
GLOVE BIO SURGEON STRL SZ8 (GLOVE) ×3 IMPLANT
GLOVE BIO SURGEONS STRL SZ 6.5 (GLOVE) ×3
GLOVE BIOGEL PI IND STRL 6.5 (GLOVE) IMPLANT
GLOVE BIOGEL PI IND STRL 7.5 (GLOVE) ×2 IMPLANT
GLOVE BIOGEL PI IND STRL 8 (GLOVE) ×1 IMPLANT
GLOVE BIOGEL PI INDICATOR 6.5 (GLOVE) ×8
GLOVE BIOGEL PI INDICATOR 7.5 (GLOVE) ×4
GLOVE BIOGEL PI INDICATOR 8 (GLOVE) ×2
GLOVE SURG SS PI 7.5 STRL IVOR (GLOVE) ×3 IMPLANT
GOWN STRL REIN XL XLG (GOWN DISPOSABLE) ×2 IMPLANT
GOWN STRL REUS W/ TWL LRG LVL3 (GOWN DISPOSABLE) ×5 IMPLANT
GOWN STRL REUS W/ TWL XL LVL3 (GOWN DISPOSABLE) ×1 IMPLANT
GOWN STRL REUS W/TWL 2XL LVL3 (GOWN DISPOSABLE) ×1 IMPLANT
GOWN STRL REUS W/TWL LRG LVL3 (GOWN DISPOSABLE) ×15
GOWN STRL REUS W/TWL XL LVL3 (GOWN DISPOSABLE) ×3
GUIDE ROD 3.0 (MISCELLANEOUS) ×3
KIT BASIN OR (CUSTOM PROCEDURE TRAY) ×6 IMPLANT
KIT ROOM TURNOVER OR (KITS) ×6 IMPLANT
NAIL RETROGRADE 10X40 (Nail) ×2 IMPLANT
NDL FILTER BLUNT 18X1 1/2 (NEEDLE) IMPLANT
NEEDLE FILTER BLUNT 18X 1/2SAF (NEEDLE) ×2
NEEDLE FILTER BLUNT 18X1 1/2 (NEEDLE) ×1 IMPLANT
NS IRRIG 1000ML POUR BTL (IV SOLUTION) ×6 IMPLANT
PACK CV ACCESS (CUSTOM PROCEDURE TRAY) ×2 IMPLANT
PACK GENERAL/GYN (CUSTOM PROCEDURE TRAY) ×1 IMPLANT
PACK ORTHO EXTREMITY (CUSTOM PROCEDURE TRAY) ×3 IMPLANT
PACK UNIVERSAL I (CUSTOM PROCEDURE TRAY) ×3 IMPLANT
PAD ARMBOARD 7.5X6 YLW CONV (MISCELLANEOUS) ×12 IMPLANT
PADDING CAST COTTON 6X4 STRL (CAST SUPPLIES) ×2 IMPLANT
ROD GUIDE 3.0 (MISCELLANEOUS) IMPLANT
SCREW TRIGEN LOW PROF 5.0X35 (Screw) ×4 IMPLANT
SCREW TRIGEN LOW PROF 5.0X67.5 (Screw) ×2 IMPLANT
SCREW TRIGEN LOW PROF 5.0X80 (Screw) ×2 IMPLANT
SET CYSTO W/LG BORE CLAMP LF (SET/KITS/TRAYS/PACK) ×2 IMPLANT
SPLINT FIBERGLASS 4X30 (CAST SUPPLIES) ×2 IMPLANT
SPONGE GAUZE 4X4 12PLY STER LF (GAUZE/BANDAGES/DRESSINGS) ×2 IMPLANT
SPONGE LAP 18X18 X RAY DECT (DISPOSABLE) ×3 IMPLANT
SPONGE SURGIFOAM ABS GEL 100 (HEMOSTASIS) ×2 IMPLANT
STAPLER VISISTAT 35W (STAPLE) ×7 IMPLANT
STOCKINETTE IMPERVIOUS LG (DRAPES) ×3 IMPLANT
SUT PROLENE 3 0 PS 2 (SUTURE) IMPLANT
SUT PROLENE 6 0 BV (SUTURE) ×4 IMPLANT
SUT VIC AB 0 CT1 27 (SUTURE) ×3
SUT VIC AB 0 CT1 27XBRD ANBCTR (SUTURE) IMPLANT
SUT VIC AB 2-0 CT1 27 (SUTURE) ×3
SUT VIC AB 2-0 CT1 TAPERPNT 27 (SUTURE) IMPLANT
SUT VIC AB 2-0 CT3 27 (SUTURE) ×4 IMPLANT
SUT VIC AB 3-0 SH 27 (SUTURE) ×9
SUT VIC AB 3-0 SH 27X BRD (SUTURE) IMPLANT
SUT VICRYL 4-0 PS2 18IN ABS (SUTURE) ×2 IMPLANT
SYR 20CC LL (SYRINGE) ×2 IMPLANT
SYR 5ML LL (SYRINGE) ×2 IMPLANT
TOWEL OR 17X24 6PK STRL BLUE (TOWEL DISPOSABLE) ×7 IMPLANT
TOWEL OR 17X26 10 PK STRL BLUE (TOWEL DISPOSABLE) ×10 IMPLANT
TUBE CONNECTING 12'X1/4 (SUCTIONS) ×2
TUBE CONNECTING 12X1/4 (SUCTIONS) ×3 IMPLANT
WATER STERILE IRR 1000ML POUR (IV SOLUTION) ×6 IMPLANT
YANKAUER SUCT BULB TIP NO VENT (SUCTIONS) ×3 IMPLANT
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
## 2015-04-05 NOTE — Procedures (Signed)
R femoral Cordis catheter placed Port flushed and aspirated well Pt tol procedure well
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
## 2015-04-05 NOTE — Anesthesia Procedure Notes (Signed)
Procedure Name: Intubation Date/Time: 04/05/2015 8:40 PM Performed by: Little IshikawaMERCER, Balraj Brayfield L Pre-anesthesia Checklist: Patient identified, Timeout performed, Emergency Drugs available, Suction available and Patient being monitored Patient Re-evaluated:Patient Re-evaluated prior to inductionOxygen Delivery Method: Circle system utilized Preoxygenation: Pre-oxygenation with 100% oxygen Intubation Type: IV induction, Cricoid Pressure applied and Rapid sequence Laryngoscope Size: Mac and 4 Grade View: Grade I Tube type: Subglottic suction tube Tube size: 7.5 mm Number of attempts: 1 Airway Equipment and Method: Stylet Placement Confirmation: ETT inserted through vocal cords under direct vision,  positive ETCO2 and breath sounds checked- equal and bilateral Secured at: 23 cm Tube secured with: Tape Dental Injury: Teeth and Oropharynx as per pre-operative assessment
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
## 2015-04-05 NOTE — Procedures (Signed)
Emergent R chest tube placed for PTX.  40Fr chest tube Pt tol Proc well CXR/CT assure good position
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
## 2015-04-05 NOTE — ED Notes (Signed)
[employer] to bedside. 
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
## 2015-04-05 NOTE — ED Notes (Signed)
Pt to CT with trauma MD and 2 RNs

## 2015-04-06 ENCOUNTER — Inpatient Hospital Stay (HOSPITAL_COMMUNITY): Payer: Self-pay

## 2015-04-06 ENCOUNTER — Inpatient Hospital Stay (HOSPITAL_COMMUNITY): Payer: MEDICAID

## 2015-04-06 ENCOUNTER — Encounter (HOSPITAL_COMMUNITY): Payer: Self-pay | Admitting: Vascular Surgery

## 2015-04-06 ENCOUNTER — Other Ambulatory Visit: Payer: Self-pay

## 2015-04-06 DIAGNOSIS — T07XXXA Unspecified multiple injuries, initial encounter: Secondary | ICD-10-CM

## 2015-04-06 DIAGNOSIS — W3400XA Accidental discharge from unspecified firearms or gun, initial encounter: Secondary | ICD-10-CM

## 2015-04-06 LAB — CBC
HEMATOCRIT: 31 % — AB (ref 39.0–52.0)
Hemoglobin: 10.7 g/dL — ABNORMAL LOW (ref 13.0–17.0)
MCH: 30.2 pg (ref 26.0–34.0)
MCHC: 34.5 g/dL (ref 30.0–36.0)
MCV: 87.6 fL (ref 78.0–100.0)
PLATELETS: 127 10*3/uL — AB (ref 150–400)
RBC: 3.54 MIL/uL — ABNORMAL LOW (ref 4.22–5.81)
RDW: 16 % — AB (ref 11.5–15.5)
WBC: 7 10*3/uL (ref 4.0–10.5)

## 2015-04-06 LAB — PROTIME-INR
INR: 1.33 (ref 0.00–1.49)
Prothrombin Time: 16.6 seconds — ABNORMAL HIGH (ref 11.6–15.2)

## 2015-04-06 LAB — POCT I-STAT 7, (LYTES, BLD GAS, ICA,H+H)
ACID-BASE DEFICIT: 5 mmol/L — AB (ref 0.0–2.0)
ACID-BASE DEFICIT: 5 mmol/L — AB (ref 0.0–2.0)
Acid-base deficit: 6 mmol/L — ABNORMAL HIGH (ref 0.0–2.0)
BICARBONATE: 21 meq/L (ref 20.0–24.0)
Bicarbonate: 22 meq/L (ref 20.0–24.0)
Bicarbonate: 20.3 meq/L (ref 20.0–24.0)
CALCIUM ION: 1.06 mmol/L — AB (ref 1.12–1.23)
Calcium, Ion: 1.07 mmol/L — ABNORMAL LOW (ref 1.12–1.23)
Calcium, Ion: 1.03 mmol/L — ABNORMAL LOW (ref 1.12–1.23)
HCT: 31 % — ABNORMAL LOW (ref 39.0–52.0)
HCT: 30 % — ABNORMAL LOW (ref 39.0–52.0)
HEMATOCRIT: 27 % — AB (ref 39.0–52.0)
HEMOGLOBIN: 10.5 g/dL — AB (ref 13.0–17.0)
Hemoglobin: 9.2 g/dL — ABNORMAL LOW (ref 13.0–17.0)
Hemoglobin: 10.2 g/dL — ABNORMAL LOW (ref 13.0–17.0)
O2 SAT: 100 %
O2 Saturation: 100 %
O2 Saturation: 100 %
PCO2 ART: 45.1 mmHg — AB (ref 35.0–45.0)
PCO2 ART: 44.4 mmHg (ref 35.0–45.0)
PH ART: 7.289 — AB (ref 7.350–7.450)
PO2 ART: 452 mmHg — AB (ref 80.0–100.0)
PO2 ART: 490 mmHg — AB (ref 80.0–100.0)
PO2 ART: 398 mmHg — AB (ref 80.0–100.0)
Patient temperature: 35.5
Potassium: 5 mmol/L (ref 3.5–5.1)
Potassium: 5.8 mmol/L — ABNORMAL HIGH (ref 3.5–5.1)
Potassium: 6.2 mmol/L (ref 3.5–5.1)
SODIUM: 136 mmol/L (ref 135–145)
Sodium: 138 mmol/L (ref 135–145)
Sodium: 136 mmol/L (ref 135–145)
TCO2: 23 mmol/L (ref 0–100)
TCO2: 22 mmol/L (ref 0–100)
TCO2: 21 mmol/L (ref 0–100)
pCO2 arterial: 36.5 mmHg (ref 35.0–45.0)
pH, Arterial: 7.283 — ABNORMAL LOW (ref 7.350–7.450)
pH, Arterial: 7.345 — ABNORMAL LOW (ref 7.350–7.450)

## 2015-04-06 LAB — BASIC METABOLIC PANEL
ANION GAP: 7 (ref 5–15)
BUN: 8 mg/dL (ref 6–20)
CALCIUM: 6.8 mg/dL — AB (ref 8.9–10.3)
CO2: 20 mmol/L — AB (ref 22–32)
CREATININE: 1.12 mg/dL (ref 0.61–1.24)
Chloride: 108 mmol/L (ref 101–111)
Glucose, Bld: 115 mg/dL — ABNORMAL HIGH (ref 65–99)
Potassium: 4.4 mmol/L (ref 3.5–5.1)
SODIUM: 135 mmol/L (ref 135–145)

## 2015-04-06 LAB — POCT I-STAT 4, (NA,K, GLUC, HGB,HCT)
GLUCOSE: 95 mg/dL (ref 65–99)
HCT: 30 % — ABNORMAL LOW (ref 39.0–52.0)
HEMOGLOBIN: 10.2 g/dL — AB (ref 13.0–17.0)
POTASSIUM: 4.1 mmol/L (ref 3.5–5.1)
SODIUM: 142 mmol/L (ref 135–145)

## 2015-04-06 LAB — APTT: aPTT: 31 seconds (ref 24–37)

## 2015-04-06 LAB — LACTIC ACID, PLASMA: Lactic Acid, Venous: 1.6 mmol/L (ref 0.5–2.0)

## 2015-04-06 LAB — MRSA PCR SCREENING: MRSA by PCR: NEGATIVE

## 2015-04-06 LAB — TRIGLYCERIDES: Triglycerides: 57 mg/dL (ref <150)

## 2015-04-06 LAB — BLOOD PRODUCT ORDER (VERBAL) VERIFICATION

## 2015-04-06 MED ORDER — POTASSIUM CHLORIDE 2 MEQ/ML IV SOLN
INTRAVENOUS | Status: DC
  Administered 2015-04-06 – 2015-04-10 (×9): via INTRAVENOUS
  Filled 2015-04-06 (×17): qty 1000

## 2015-04-06 MED ORDER — CEFAZOLIN SODIUM-DEXTROSE 2-3 GM-% IV SOLR
INTRAVENOUS | Status: AC
Start: 2015-04-06 — End: 2015-04-06
  Filled 2015-04-06: qty 50

## 2015-04-06 MED ORDER — ALBUMIN HUMAN 5 % IV SOLN
INTRAVENOUS | Status: AC
  Administered 2015-04-06: 12.5 g
  Filled 2015-04-06: qty 250

## 2015-04-06 MED ORDER — HYDRALAZINE HCL 20 MG/ML IJ SOLN: 5.0000 mg | INTRAMUSCULAR | Status: DC | PRN

## 2015-04-06 MED ORDER — CHLORHEXIDINE GLUCONATE 0.12% ORAL RINSE (MEDLINE KIT)
15.0000 mL | Freq: Two times a day (BID) | OROMUCOSAL | Status: DC
  Administered 2015-04-06 – 2015-04-07 (×4): 15 mL via OROMUCOSAL

## 2015-04-06 MED ORDER — DEXTROSE-NACL 5-0.9 % IV SOLN
INTRAVENOUS | Status: DC
  Administered 2015-04-06: 03:00:00 via INTRAVENOUS

## 2015-04-06 MED ORDER — SODIUM CHLORIDE 0.9 % IV SOLN: 500.0000 mL | Freq: Once | INTRAVENOUS | Status: DC | PRN

## 2015-04-06 MED ORDER — PROPOFOL 1000 MG/100ML IV EMUL
5.0000 ug/kg/min | INTRAVENOUS | Status: DC
  Administered 2015-04-06: 50 ug/kg/min via INTRAVENOUS
  Filled 2015-04-06 (×2): qty 100

## 2015-04-06 MED ORDER — MORPHINE SULFATE (PF) 2 MG/ML IV SOLN
2.0000 mg | INTRAVENOUS | Status: DC | PRN
  Administered 2015-04-06 (×6): 2 mg via INTRAVENOUS
  Administered 2015-04-07: 4 mg via INTRAVENOUS
  Administered 2015-04-07: 2 mg via INTRAVENOUS
  Administered 2015-04-07 (×2): 4 mg via INTRAVENOUS
  Administered 2015-04-07: 2 mg via INTRAVENOUS
  Administered 2015-04-07 (×7): 4 mg via INTRAVENOUS
  Administered 2015-04-07: 2 mg via INTRAVENOUS
  Administered 2015-04-07 – 2015-04-08 (×6): 4 mg via INTRAVENOUS
  Filled 2015-04-06 (×7): qty 2
  Filled 2015-04-06: qty 1
  Filled 2015-04-06 (×3): qty 2
  Filled 2015-04-06: qty 1
  Filled 2015-04-06 (×2): qty 2
  Filled 2015-04-06: qty 1
  Filled 2015-04-06 (×2): qty 2
  Filled 2015-04-06 (×2): qty 1
  Filled 2015-04-06 (×3): qty 2
  Filled 2015-04-06: qty 1
  Filled 2015-04-06: qty 2

## 2015-04-06 MED ORDER — MAGNESIUM SULFATE 2 GM/50ML IV SOLN
2.0000 g | Freq: Every day | INTRAVENOUS | Status: DC | PRN
  Filled 2015-04-06: qty 50

## 2015-04-06 MED ORDER — GUAIFENESIN-DM 100-10 MG/5ML PO SYRP: 15.0000 mL | ORAL_SOLUTION | ORAL | Status: DC | PRN

## 2015-04-06 MED ORDER — LABETALOL HCL 5 MG/ML IV SOLN
10.0000 mg | INTRAVENOUS | Status: DC | PRN
  Filled 2015-04-06: qty 4

## 2015-04-06 MED ORDER — ONDANSETRON HCL 4 MG/2ML IJ SOLN: 4.0000 mg | Freq: Four times a day (QID) | INTRAMUSCULAR | Status: DC | PRN

## 2015-04-06 MED ORDER — PROPOFOL 500 MG/50ML IV EMUL
INTRAVENOUS | Status: DC | PRN
  Administered 2015-04-06: 25 ug/kg/min via INTRAVENOUS

## 2015-04-06 MED ORDER — PHENOL 1.4 % MT LIQD: 1.0000 | OROMUCOSAL | Status: DC | PRN

## 2015-04-06 MED ORDER — ENOXAPARIN SODIUM 40 MG/0.4ML ~~LOC~~ SOLN
40.0000 mg | SUBCUTANEOUS | Status: DC
  Administered 2015-04-06 – 2015-04-12 (×7): 40 mg via SUBCUTANEOUS
  Filled 2015-04-06 (×8): qty 0.4

## 2015-04-06 MED ORDER — PROPOFOL 1000 MG/100ML IV EMUL
INTRAVENOUS | Status: AC
  Administered 2015-04-06: 50 mg via INTRAVENOUS
  Filled 2015-04-06: qty 100

## 2015-04-06 MED ORDER — PANTOPRAZOLE SODIUM 40 MG IV SOLR
40.0000 mg | Freq: Every day | INTRAVENOUS | Status: DC
  Administered 2015-04-06 – 2015-04-08 (×3): 40 mg via INTRAVENOUS
  Filled 2015-04-06 (×4): qty 40

## 2015-04-06 MED ORDER — ANTISEPTIC ORAL RINSE SOLUTION (CORINZ)
7.0000 mL | OROMUCOSAL | Status: DC
  Administered 2015-04-06 – 2015-04-07 (×12): 7 mL via OROMUCOSAL

## 2015-04-06 MED ORDER — PROPOFOL 1000 MG/100ML IV EMUL
5.0000 ug/kg/min | INTRAVENOUS | Status: DC
  Administered 2015-04-06: 50 mg via INTRAVENOUS

## 2015-04-06 MED ORDER — ANTISEPTIC ORAL RINSE SOLUTION (CORINZ)
7.0000 mL | Freq: Four times a day (QID) | OROMUCOSAL | Status: DC
  Administered 2015-04-06: 7 mL via OROMUCOSAL

## 2015-04-06 MED ORDER — ACETAMINOPHEN 650 MG RE SUPP: 325.0000 mg | RECTAL | Status: DC | PRN

## 2015-04-06 MED ORDER — ALBUMIN HUMAN 5 % IV SOLN
25.0000 g | Freq: Once | INTRAVENOUS | Status: AC
  Administered 2015-04-06: 12.5 g via INTRAVENOUS
  Filled 2015-04-06: qty 250

## 2015-04-06 MED ORDER — DEXTROSE 5 % IV SOLN
1.5000 g | Freq: Two times a day (BID) | INTRAVENOUS | Status: AC
  Administered 2015-04-06 (×2): 1.5 g via INTRAVENOUS
  Filled 2015-04-06 (×2): qty 1.5

## 2015-04-06 MED ORDER — ALUM & MAG HYDROXIDE-SIMETH 200-200-20 MG/5ML PO SUSP: 15.0000 mL | ORAL | Status: DC | PRN

## 2015-04-06 MED ORDER — ACETAMINOPHEN 325 MG PO TABS: 325.0000 mg | ORAL_TABLET | ORAL | Status: DC | PRN

## 2015-04-06 MED ORDER — DEXMEDETOMIDINE HCL IN NACL 200 MCG/50ML IV SOLN
0.2000 ug/kg/h | INTRAVENOUS | Status: DC
  Administered 2015-04-06 (×2): 0.2 ug/kg/h via INTRAVENOUS
  Administered 2015-04-07 (×2): 0.4 ug/kg/h via INTRAVENOUS
  Filled 2015-04-06 (×4): qty 50

## 2015-04-06 MED ORDER — IPRATROPIUM-ALBUTEROL 0.5-2.5 (3) MG/3ML IN SOLN
3.0000 mL | Freq: Four times a day (QID) | RESPIRATORY_TRACT | Status: DC
  Administered 2015-04-06 – 2015-04-07 (×4): 3 mL via RESPIRATORY_TRACT
  Filled 2015-04-06 (×4): qty 3

## 2015-04-06 MED ORDER — METOPROLOL TARTRATE 1 MG/ML IV SOLN
2.0000 mg | INTRAVENOUS | Status: DC | PRN
  Filled 2015-04-06: qty 5

## 2015-04-06 MED ORDER — WHITE PETROLATUM GEL
Status: AC
  Administered 2015-04-06: 0.2
  Filled 2015-04-06: qty 1

## 2015-04-06 MED ORDER — KCL IN DEXTROSE-NACL 20-5-0.45 MEQ/L-%-% IV SOLN
INTRAVENOUS | Status: DC
  Filled 2015-04-06 (×3): qty 1000

## 2015-04-06 MED ORDER — POTASSIUM CHLORIDE CRYS ER 20 MEQ PO TBCR
20.0000 meq | EXTENDED_RELEASE_TABLET | Freq: Every day | ORAL | Status: AC | PRN
  Administered 2015-04-08: 20 meq via ORAL
  Filled 2015-04-06: qty 1
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
## 2015-04-06 NOTE — Anesthesia Postprocedure Evaluation (Signed)
Anesthesia Post Note  Patient: Mark Nelson  Procedure(s) Performed: Procedure(s) (LRB): RIGHT BRACHIAL ARTERY EXPLORATION; INTERPOSITIONAL GRAFT FOR RIGHT BRAHCIAL ARTERY REPAIR (Right) INTRAMEDULLARY (IM) RETROGRADE FEMORAL NAILING (Right)  Patient location during evaluation: ICU Anesthesia Type: General Level of consciousness: sedated and patient remains intubated per anesthesia plan Pain management: satisfactory to patient Vital Signs Assessment: post-procedure vital signs reviewed and stable Respiratory status: patient remains intubated per anesthesia plan Cardiovascular status: stable Postop Assessment: No signs of nausea or vomiting Anesthetic complications: no    Last Vitals:  Filed Vitals:   04/06/15 0415 04/06/15 0430  BP: 107/73 99/70  Pulse: 113 115  Temp:    Resp: 14 15    Last Pain: There were no vitals filed for this visit.               Kennieth RadFitzgerald, Maebell Lyvers E
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
## 2015-04-06 NOTE — Transfer of Care (Signed)
Immediate Anesthesia Transfer of Care Note  Patient: [name]  Procedure(s) Performed: Procedure(s): RIGHT BRACHIAL ARTERY EXPLORATION; INTERPOSITIONAL GRAFT FOR RIGHT BRAHCIAL ARTERY REPAIR (Right) INTRAMEDULLARY (IM) RETROGRADE FEMORAL NAILING (Right)  Patient Location: NICU  Anesthesia Type:General  Level of Consciousness: sedated and Patient remains intubated per anesthesia plan  Airway & Oxygen Therapy: Patient remains intubated per anesthesia plan and Patient placed on Ventilator (see vital sign flow sheet for setting)  Post-op Assessment: Report given to RN and Post -op Vital signs reviewed and stable  Post vital signs: Reviewed and stable  Last Vitals:  Filed Vitals:   04/05/15 1940 04/05/15 1945  BP: 145/95 127/92  Pulse: 116 107  Temp:    Resp: 26 22    Complications: No apparent anesthesia complications
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
## 2015-04-07 ENCOUNTER — Inpatient Hospital Stay (HOSPITAL_COMMUNITY): Payer: Self-pay

## 2015-04-07 ENCOUNTER — Encounter (HOSPITAL_COMMUNITY): Payer: Self-pay | Admitting: *Deleted

## 2015-04-07 LAB — BASIC METABOLIC PANEL
ANION GAP: 4 — AB (ref 5–15)
ANION GAP: 3 — AB (ref 5–15)
BUN: <5 mg/dL — ABNORMAL LOW (ref 6–20)
BUN: <5 mg/dL — ABNORMAL LOW (ref 6–20)
CALCIUM: 6.9 mg/dL — AB (ref 8.9–10.3)
CALCIUM: 7 mg/dL — AB (ref 8.9–10.3)
CHLORIDE: 109 mmol/L (ref 101–111)
CHLORIDE: 109 mmol/L (ref 101–111)
CO2: 25 mmol/L (ref 22–32)
CO2: 26 mmol/L (ref 22–32)
CREATININE: 0.9 mg/dL (ref 0.61–1.24)
Creatinine, Ser: 0.9 mg/dL (ref 0.61–1.24)
GFR calc Af Amer: >60 mL/min (ref >60)
GFR calc Af Amer: >60 mL/min (ref >60)
GFR calc non Af Amer: >60 mL/min (ref >60)
GFR calc non Af Amer: >60 mL/min (ref >60)
GLUCOSE: 151 mg/dL — AB (ref 65–99)
GLUCOSE: 145 mg/dL — AB (ref 65–99)
Potassium: 3.7 mmol/L (ref 3.5–5.1)
Potassium: 3.7 mmol/L (ref 3.5–5.1)
Sodium: 138 mmol/L (ref 135–145)
Sodium: 138 mmol/L (ref 135–145)

## 2015-04-07 LAB — TYPE AND SCREEN
ABO/RH(D): O POS
Antibody Screen: NEGATIVE
UNIT DIVISION: 0
UNIT DIVISION: 0
UNIT DIVISION: 0
UNIT DIVISION: 0
Unit division: 0
Unit division: 0
Unit division: 0

## 2015-04-07 LAB — CBC
HCT: 18.4 % — ABNORMAL LOW (ref 39.0–52.0)
HEMOGLOBIN: 6.3 g/dL — AB (ref 13.0–17.0)
MCH: 29.9 pg (ref 26.0–34.0)
MCHC: 34.2 g/dL (ref 30.0–36.0)
MCV: 87.2 fL (ref 78.0–100.0)
Platelets: 83 10*3/uL — ABNORMAL LOW (ref 150–400)
RBC: 2.11 MIL/uL — ABNORMAL LOW (ref 4.22–5.81)
RDW: 15.6 % — ABNORMAL HIGH (ref 11.5–15.5)
WBC: 6.6 10*3/uL (ref 4.0–10.5)

## 2015-04-07 LAB — PREPARE RBC (CROSSMATCH)

## 2015-04-07 LAB — PROTIME-INR
INR: 1.37 (ref 0.00–1.49)
PROTHROMBIN TIME: 17 s — AB (ref 11.6–15.2)

## 2015-04-07 MED ORDER — ANTISEPTIC ORAL RINSE SOLUTION (CORINZ): 7.0000 mL | OROMUCOSAL | Status: DC

## 2015-04-07 MED ORDER — INFLUENZA VAC SPLIT QUAD 0.5 ML IM SUSY
0.5000 mL | PREFILLED_SYRINGE | INTRAMUSCULAR | Status: AC
  Administered 2015-04-08: 0.5 mL via INTRAMUSCULAR
  Filled 2015-04-07: qty 0.5

## 2015-04-07 MED ORDER — BETHANECHOL CHLORIDE 25 MG PO TABS
25.0000 mg | ORAL_TABLET | Freq: Three times a day (TID) | ORAL | Status: DC
  Administered 2015-04-07 – 2015-04-11 (×15): 25 mg via ORAL
  Filled 2015-04-07 (×18): qty 1

## 2015-04-07 MED ORDER — CETYLPYRIDINIUM CHLORIDE 0.05 % MT LIQD: 7.0000 mL | Freq: Two times a day (BID) | OROMUCOSAL | Status: DC

## 2015-04-07 MED ORDER — MORPHINE SULFATE (PF) 2 MG/ML IV SOLN: 2.0000 mg | INTRAVENOUS | Status: DC | PRN

## 2015-04-07 MED ORDER — IPRATROPIUM-ALBUTEROL 0.5-2.5 (3) MG/3ML IN SOLN: 3.0000 mL | Freq: Four times a day (QID) | RESPIRATORY_TRACT | Status: DC | PRN

## 2015-04-07 MED ORDER — CHLORHEXIDINE GLUCONATE 0.12 % MT SOLN
15.0000 mL | Freq: Two times a day (BID) | OROMUCOSAL | Status: DC
  Administered 2015-04-07: 15 mL via OROMUCOSAL
  Filled 2015-04-07 (×3): qty 15

## 2015-04-07 MED ORDER — SODIUM CHLORIDE 0.9 % IV SOLN
Freq: Once | INTRAVENOUS | Status: AC
  Administered 2015-04-07: 10:00:00 via INTRAVENOUS

## 2015-04-07 MED ORDER — OXYCODONE-ACETAMINOPHEN 5-325 MG PO TABS
1.0000 | ORAL_TABLET | ORAL | Status: DC | PRN
  Administered 2015-04-07 – 2015-04-08 (×4): 2 via ORAL
  Filled 2015-04-07 (×4): qty 2

## 2015-04-07 NOTE — Progress Notes (Signed)
   Subjective:  Patient reports pain as moderate.    Objective:   VITALS:   Filed Vitals:   04/07/15 1200 04/07/15 1230 04/07/15 1300 04/07/15 1317  BP: 106/55 110/70 113/64 115/64  Pulse: 86 102 87 91  Temp:  98.3 F (36.8 C)  99 F (37.2 C)  TempSrc:  Oral  Oral  Resp: 20 24 19 20   Weight:      SpO2: 100% 100% 100% 100%    Neurologically intact Neurovascular intact Sensation intact distally Intact pulses distally Dorsiflexion/Plantar flexion intact Incision: dressing C/D/I and no drainage No cellulitis present Compartment soft   Lab Results  Component Value Date   WBC 6.6 04/07/2015   HGB 6.3* 04/07/2015   HCT 18.4* 04/07/2015   MCV 87.2 04/07/2015   PLT 83* 04/07/2015     Assessment/Plan:  2 Days Post-Op   - Expected postop acute blood loss anemia - will monitor for symptoms - Up with PT/OT when able - DVT ppx - SCDs, ambulation, lovenox - NWB RUE, RLE - Pain control - Discharge planning  Mark Nelson, Mark Nelson 04/07/2015, 1:40 PM [phone]
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
## 2015-04-07 NOTE — Progress Notes (Signed)
Orthopedic Tech Progress Note Patient Details:  Mark Nelson [birth year] [id number]  Ortho Devices Ortho Device/Splint Location: Rica Mastkuzman sling RUE Ortho Device/Splint Interventions: Ordered, Application   Jennye MoccasinHughes, [name] [name] 04/07/2015, 8:09 PM
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
## 2015-04-07 NOTE — Progress Notes (Signed)
  [facility] Progress Note  Subjective  - POD #2  Sedation weaned. Opens eyes. Follows commands.   Objective Filed Vitals:   04/07/15 0700 04/07/15 0749  BP: 95/50   Pulse: 87   Temp:  99.4 F (37.4 C)  Resp: 16     Intake/Output Summary (Last 24 hours) at 04/07/15 0816 Last data filed at 04/07/15 0700  Gross per 24 hour  Intake 2490.62 ml  Output   2000 ml  Net 490.62 ml   Follows commands on vent Strong right radial doppler flow.  Right foot with brisk DP/PT doppler flow   Assessment/Planning: 24 y.o. male is s/p: right arm exploration and repair of right brachial artery with interposition vein graft.  2 Days Post-Op   Right radial doppler flow. Did not take down dressing as arm is splinted. Humerus fx per ortho.  Hgb 6.6 this am. Transfuse per primary.   Raymond GurneyKimberly A Starlena Beil 04/07/2015 8:16 AM --  Laboratory CBC    Component Value Date/Time   WBC 6.6 04/07/2015 0700   HGB 6.3* 04/07/2015 0700   HCT 18.4* 04/07/2015 0700   PLT 83* 04/07/2015 0700    BMET    Component Value Date/Time   NA 138 04/07/2015 0700   K 3.7 04/07/2015 0700   CL 109 04/07/2015 0700   CO2 26 04/07/2015 0700   GLUCOSE 145* 04/07/2015 0700   BUN <5* 04/07/2015 0700   CREATININE 0.90 04/07/2015 0700   CALCIUM 7.0* 04/07/2015 0700   GFRNONAA >60 04/07/2015 0700   GFRAA >60 04/07/2015 0700    COAG Lab Results  Component Value Date   INR 1.33 04/06/2015   INR 1.12 04/05/2015   No results found for: PTT  Antibiotics Anti-infectives    Start     Dose/Rate Route Frequency Ordered Stop   04/06/15 0300  cefUROXime (ZINACEF) 1.5 g in dextrose 5 % 50 mL IVPB     1.5 g 100 mL/hr over 30 Minutes Intravenous Every 12 hours 04/06/15 0228 04/06/15 1631       Maris BergerKimberly Marcellis Frampton, PA-C [facility] Office: [phone] Pager: [phone] 04/07/2015 8:16 AM

## 2015-04-07 NOTE — Progress Notes (Signed)
Pt removed sling from IV pole, sling still on pt's right arm. Pt stated arm is more painful when hanging from IV pole; instructed pt to keep arm elevated as much as possible. Pt's arm resting on two pillows, will continue to monitor.

## 2015-04-07 NOTE — Progress Notes (Signed)
PT Cancellation Note  Patient Details Name: Mark Nelson MRN: [id number] DOB: 1990-10-15   Cancelled Treatment:    Reason Eval/Treat Not Completed: Patient not medically ready. RN deferred PT evaluation today as pt to remain on bedrest until tomorrow. PT to return as able.   Marcene BrawnChadwell, Calista Crain Marie 04/07/2015, 2:55 PM  Lewis ShockAshly Treshun Wold, PT, DPT Pager #: [phone] Office #: [phone]

## 2015-04-07 NOTE — Progress Notes (Signed)
Patient c/o extreme pain in the right arm particularly in the right palm region. Patient getting pain medication regularly and per the patient, he is not obtaining relief. [name] with ortho called, per MD call ortho tech to loosen bandages. Ortho tech called. Will cont to monitor.
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
## 2015-04-07 NOTE — Procedures (Signed)
Extubation Procedure Note  Patient Details:   Name: [name] DOB: 1990/07/19 MRN: [id number]   Airway Documentation:     Evaluation  O2 sats: stable throughout Complications: No apparent complications Patient did tolerate procedure well. Bilateral Breath Sounds: Clear, Diminished Suctioning: Airway Yes  Patient tolerated wean. MD ordered to extubate. Positive for cuff leak. Patient extubated to a 2 Lpm nasal cannula. No signs of dyspnea or stridor. Patient resting comfortably. RN at bedside. Will continue to monitor.  Ancil BoozerSmallwood, Kaleab Frasier 04/07/2015, 10:52 AM

## 2015-04-07 NOTE — Clinical Documentation Improvement (Signed)
Orthopedic Trauma Vascular Surgery  Based on the clinical findings below, please document any associated diagnoses/conditions the patient has or may have.   Multiple right rib fractures  Other  Clinically Undetermined  Supporting Information: -- CT chest 11/21: "Multiple comminuted right anterior rib fractures. Right upper and right middle lung contusions. Small effusion and atelectasis in the right lower lung."  Please exercise your independent, professional judgment when responding. A specific answer is not anticipated or expected.   Thank You, Beverley FiedlerLaurie E Keaunna Skipper RN [facility] [employer] [facility] [place] [phone]

## 2015-04-08 ENCOUNTER — Inpatient Hospital Stay (HOSPITAL_COMMUNITY): Payer: Self-pay

## 2015-04-08 DIAGNOSIS — S2249XA Multiple fractures of ribs, unspecified side, initial encounter for closed fracture: Secondary | ICD-10-CM | POA: Diagnosis present

## 2015-04-08 LAB — BASIC METABOLIC PANEL
ANION GAP: 5 (ref 5–15)
CALCIUM: 7.5 mg/dL — AB (ref 8.9–10.3)
CO2: 24 mmol/L (ref 22–32)
CREATININE: 0.89 mg/dL (ref 0.61–1.24)
Chloride: 105 mmol/L (ref 101–111)
GFR calc Af Amer: >60 mL/min (ref >60)
GLUCOSE: 113 mg/dL — AB (ref 65–99)
Potassium: 3.5 mmol/L (ref 3.5–5.1)
Sodium: 134 mmol/L — ABNORMAL LOW (ref 135–145)

## 2015-04-08 LAB — TYPE AND SCREEN
ABO/RH(D): O POS
Antibody Screen: NEGATIVE
UNIT DIVISION: 0
UNIT DIVISION: 0

## 2015-04-08 LAB — CBC
HEMATOCRIT: 24.3 % — AB (ref 39.0–52.0)
Hemoglobin: 8.4 g/dL — ABNORMAL LOW (ref 13.0–17.0)
MCH: 29.6 pg (ref 26.0–34.0)
MCHC: 34.6 g/dL (ref 30.0–36.0)
MCV: 85.6 fL (ref 78.0–100.0)
PLATELETS: 108 10*3/uL — AB (ref 150–400)
RBC: 2.84 MIL/uL — ABNORMAL LOW (ref 4.22–5.81)
RDW: 15.3 % (ref 11.5–15.5)
WBC: 10 10*3/uL (ref 4.0–10.5)

## 2015-04-08 MED ORDER — MORPHINE SULFATE 2 MG/ML IV SOLN
INTRAVENOUS | Status: DC
  Administered 2015-04-08: 9 mg via INTRAVENOUS
  Administered 2015-04-08: 2 mg via INTRAVENOUS
  Administered 2015-04-08: 11 mg via INTRAVENOUS
  Administered 2015-04-09: 6 mg via INTRAVENOUS
  Administered 2015-04-09: 14 mg via INTRAVENOUS
  Administered 2015-04-09: 4 mg via INTRAVENOUS
  Administered 2015-04-09: 07:00:00 via INTRAVENOUS
  Administered 2015-04-10: 9 mg via INTRAVENOUS
  Administered 2015-04-10 – 2015-04-11 (×3): via INTRAVENOUS
  Administered 2015-04-11: 7 mg via INTRAVENOUS
  Administered 2015-04-11: 4 mg via INTRAVENOUS
  Administered 2015-04-11: 12 mg via INTRAVENOUS
  Administered 2015-04-11: 17.3 mg via INTRAVENOUS
  Filled 2015-04-08 (×6): qty 25

## 2015-04-08 MED ORDER — NALOXONE HCL 0.4 MG/ML IJ SOLN: 0.4000 mg | INTRAMUSCULAR | Status: DC | PRN

## 2015-04-08 MED ORDER — DIPHENHYDRAMINE HCL 12.5 MG/5ML PO ELIX: 12.5000 mg | ORAL_SOLUTION | Freq: Four times a day (QID) | ORAL | Status: DC | PRN

## 2015-04-08 MED ORDER — CETYLPYRIDINIUM CHLORIDE 0.05 % MT LIQD
7.0000 mL | Freq: Two times a day (BID) | OROMUCOSAL | Status: DC
  Administered 2015-04-08 – 2015-04-12 (×6): 7 mL via OROMUCOSAL

## 2015-04-08 MED ORDER — SODIUM CHLORIDE 0.9 % IJ SOLN: 9.0000 mL | INTRAMUSCULAR | Status: DC | PRN

## 2015-04-08 MED ORDER — DIPHENHYDRAMINE HCL 50 MG/ML IJ SOLN
12.5000 mg | Freq: Four times a day (QID) | INTRAMUSCULAR | Status: DC | PRN
  Administered 2015-04-08 – 2015-04-11 (×8): 12.5 mg via INTRAVENOUS
  Filled 2015-04-08 (×7): qty 1
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
## 2015-04-09 ENCOUNTER — Inpatient Hospital Stay (HOSPITAL_COMMUNITY): Payer: Self-pay

## 2015-04-09 LAB — CBC
HCT: 24.4 % — ABNORMAL LOW (ref 39.0–52.0)
Hemoglobin: 8.4 g/dL — ABNORMAL LOW (ref 13.0–17.0)
MCH: 29.4 pg (ref 26.0–34.0)
MCHC: 34.4 g/dL (ref 30.0–36.0)
MCV: 85.3 fL (ref 78.0–100.0)
PLATELETS: 154 10*3/uL (ref 150–400)
RBC: 2.86 MIL/uL — AB (ref 4.22–5.81)
RDW: 14.7 % (ref 11.5–15.5)
WBC: 9 10*3/uL (ref 4.0–10.5)

## 2015-04-09 MED ORDER — OXYCODONE-ACETAMINOPHEN 5-325 MG PO TABS
1.0000 | ORAL_TABLET | ORAL | Status: DC | PRN
Start: 2015-04-09 — End: 2015-04-12
  Administered 2015-04-09 – 2015-04-12 (×13): 2 via ORAL
  Filled 2015-04-09 (×13): qty 2

## 2015-04-09 MED ORDER — PANTOPRAZOLE SODIUM 40 MG PO TBEC
40.0000 mg | DELAYED_RELEASE_TABLET | Freq: Every day | ORAL | Status: DC
  Administered 2015-04-09 – 2015-04-11 (×2): 40 mg via ORAL
  Filled 2015-04-09 (×2): qty 1
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
## 2015-04-09 NOTE — Progress Notes (Signed)
Pt wants dressing on right arm changed. Ortho tech paged and they stated they could not change it without MD order due to splint.  Paged Dr Janee Mornhompson, ortho, he stated he could not make that call that he was just seeing patient for numbness.  Paged Dr Magnus IvanBlackman, he stated I needed to call vascular.  I paged Dr Myra GianottiBrabham, he stated we could change dressing but that it would cause a lot of pain and old drainage was ok to have on dressing.  Gave pt this information and he decided to wait to have it changed. Marisue Ivanobyn Joyice Magda RN
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
## 2015-04-10 ENCOUNTER — Inpatient Hospital Stay (HOSPITAL_COMMUNITY): Payer: Self-pay

## 2015-04-10 LAB — CBC
HEMATOCRIT: 24.6 % — AB (ref 39.0–52.0)
HEMOGLOBIN: 8.5 g/dL — AB (ref 13.0–17.0)
MCH: 30 pg (ref 26.0–34.0)
MCHC: 34.6 g/dL (ref 30.0–36.0)
MCV: 86.9 fL (ref 78.0–100.0)
Platelets: 208 10*3/uL (ref 150–400)
RBC: 2.83 MIL/uL — ABNORMAL LOW (ref 4.22–5.81)
RDW: 14.8 % (ref 11.5–15.5)
WBC: 8.4 10*3/uL (ref 4.0–10.5)

## 2015-04-10 MED ORDER — METHOCARBAMOL 500 MG PO TABS
1000.0000 mg | ORAL_TABLET | Freq: Three times a day (TID) | ORAL | Status: DC | PRN
  Administered 2015-04-10 – 2015-04-13 (×6): 1000 mg via ORAL
  Filled 2015-04-10 (×6): qty 2

## 2015-04-10 MED ORDER — DOCUSATE SODIUM 100 MG PO CAPS
100.0000 mg | ORAL_CAPSULE | Freq: Two times a day (BID) | ORAL | Status: DC
  Administered 2015-04-10 – 2015-04-11 (×3): 100 mg via ORAL
  Filled 2015-04-10 (×3): qty 1
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
## 2015-04-11 ENCOUNTER — Inpatient Hospital Stay (HOSPITAL_COMMUNITY): Payer: Self-pay

## 2015-04-11 LAB — CBC
HEMATOCRIT: 24.5 % — AB (ref 39.0–52.0)
HEMOGLOBIN: 8.5 g/dL — AB (ref 13.0–17.0)
MCH: 30.2 pg (ref 26.0–34.0)
MCHC: 34.7 g/dL (ref 30.0–36.0)
MCV: 87.2 fL (ref 78.0–100.0)
Platelets: 286 10*3/uL (ref 150–400)
RBC: 2.81 MIL/uL — ABNORMAL LOW (ref 4.22–5.81)
RDW: 14.5 % (ref 11.5–15.5)
WBC: 9.8 10*3/uL (ref 4.0–10.5)

## 2015-04-11 NOTE — Progress Notes (Signed)
Called report to [facility], RN unable to take report at this time. Marisue Ivanobyn Zayden Hahne RN
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
## 2015-04-12 DIAGNOSIS — S5421XA Injury of radial nerve at forearm level, right arm, initial encounter: Secondary | ICD-10-CM | POA: Diagnosis present

## 2015-04-12 DIAGNOSIS — S45101A Unspecified injury of brachial artery, right side, initial encounter: Secondary | ICD-10-CM | POA: Diagnosis present

## 2015-04-12 DIAGNOSIS — D62 Acute posthemorrhagic anemia: Secondary | ICD-10-CM | POA: Diagnosis not present

## 2015-04-12 DIAGNOSIS — J9600 Acute respiratory failure, unspecified whether with hypoxia or hypercapnia: Secondary | ICD-10-CM | POA: Diagnosis present

## 2015-04-12 DIAGNOSIS — S42301A Unspecified fracture of shaft of humerus, right arm, initial encounter for closed fracture: Secondary | ICD-10-CM | POA: Diagnosis present

## 2015-04-12 DIAGNOSIS — S20359A Superficial foreign body of unspecified front wall of thorax, initial encounter: Secondary | ICD-10-CM | POA: Diagnosis present

## 2015-04-12 DIAGNOSIS — S272XXA Traumatic hemopneumothorax, initial encounter: Secondary | ICD-10-CM | POA: Diagnosis present

## 2015-04-12 DIAGNOSIS — S7291XA Unspecified fracture of right femur, initial encounter for closed fracture: Secondary | ICD-10-CM | POA: Diagnosis present

## 2015-04-12 DIAGNOSIS — S27321A Contusion of lung, unilateral, initial encounter: Secondary | ICD-10-CM | POA: Diagnosis present

## 2015-04-12 LAB — CBC
HCT: 24.6 % — ABNORMAL LOW (ref 39.0–52.0)
Hemoglobin: 8.4 g/dL — ABNORMAL LOW (ref 13.0–17.0)
MCH: 30.2 pg (ref 26.0–34.0)
MCHC: 34.1 g/dL (ref 30.0–36.0)
MCV: 88.5 fL (ref 78.0–100.0)
Platelets: 384 10*3/uL (ref 150–400)
RBC: 2.78 MIL/uL — ABNORMAL LOW (ref 4.22–5.81)
RDW: 14.8 % (ref 11.5–15.5)
WBC: 11.8 10*3/uL — ABNORMAL HIGH (ref 4.0–10.5)

## 2015-04-12 MED ORDER — LIDOCAINE-EPINEPHRINE 1 %-1:100000 IJ SOLN
10.0000 mL | Freq: Once | INTRAMUSCULAR | Status: DC
  Filled 2015-04-12: qty 10

## 2015-04-12 MED ORDER — BETHANECHOL CHLORIDE 10 MG PO TABS
10.0000 mg | ORAL_TABLET | Freq: Four times a day (QID) | ORAL | Status: DC
  Administered 2015-04-12 – 2015-04-13 (×4): 10 mg via ORAL
  Filled 2015-04-12 (×8): qty 1

## 2015-04-12 MED ORDER — TRAMADOL HCL 50 MG PO TABS
100.0000 mg | ORAL_TABLET | Freq: Four times a day (QID) | ORAL | Status: DC
  Administered 2015-04-12 – 2015-04-13 (×4): 100 mg via ORAL
  Filled 2015-04-12 (×4): qty 2

## 2015-04-12 MED ORDER — DOCUSATE SODIUM 100 MG PO CAPS
200.0000 mg | ORAL_CAPSULE | Freq: Two times a day (BID) | ORAL | Status: DC
  Administered 2015-04-12 – 2015-04-13 (×3): 200 mg via ORAL
  Filled 2015-04-12 (×3): qty 2

## 2015-04-12 MED ORDER — POLYETHYLENE GLYCOL 3350 17 G PO PACK
17.0000 g | PACK | Freq: Every day | ORAL | Status: DC
  Administered 2015-04-12 – 2015-04-13 (×2): 17 g via ORAL
  Filled 2015-04-12 (×2): qty 1

## 2015-04-12 MED ORDER — OXYCODONE HCL 5 MG PO TABS
10.0000 mg | ORAL_TABLET | ORAL | Status: DC | PRN
  Administered 2015-04-12: 20 mg via ORAL
  Administered 2015-04-12: 10 mg via ORAL
  Administered 2015-04-13 (×3): 20 mg via ORAL
  Filled 2015-04-12 (×3): qty 4
  Filled 2015-04-12: qty 2
  Filled 2015-04-12: qty 4
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
## 2015-04-13 ENCOUNTER — Telehealth: Payer: Self-pay | Admitting: Vascular Surgery

## 2015-04-13 ENCOUNTER — Encounter (HOSPITAL_COMMUNITY): Payer: Self-pay | Admitting: General Surgery

## 2015-04-13 ENCOUNTER — Encounter (HOSPITAL_COMMUNITY): Payer: Self-pay | Admitting: Emergency Medicine

## 2015-04-13 MED ORDER — POLYETHYLENE GLYCOL 3350 17 G PO PACK
17.0000 g | PACK | Freq: Every day | ORAL | Status: AC
Start: 2015-04-13 — End: ?

## 2015-04-13 MED ORDER — TRAMADOL HCL 50 MG PO TABS: 100.0000 mg | ORAL_TABLET | Freq: Four times a day (QID) | ORAL | Status: DC | PRN

## 2015-04-13 MED ORDER — OXYCODONE HCL 10 MG PO TABS: 10.0000 mg | ORAL_TABLET | Freq: Four times a day (QID) | ORAL | Status: DC | PRN

## 2015-04-13 MED ORDER — ACETAMINOPHEN 325 MG PO TABS: 325.0000 mg | ORAL_TABLET | ORAL | Status: DC | PRN

## 2015-04-13 MED ORDER — METHOCARBAMOL 500 MG PO TABS: 1000.0000 mg | ORAL_TABLET | Freq: Three times a day (TID) | ORAL | Status: AC | PRN
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
## 2015-04-13 NOTE — Telephone Encounter (Signed)
-----   Message from Sharee PimpleMarilyn K McChesney, RN sent at 04/13/2015 11:05 AM EST ----- Regarding: schedule   ----- Message -----    From: Fransisco HertzBrian L Chen, MD    Sent: 04/13/2015   9:49 AM      To: [id number] Charge Pool  [name] 15-Mar-1991  Follow-up: 1 week for staple removal (nurse visit)
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
## 2015-04-13 NOTE — Telephone Encounter (Signed)
No answer, no vm, dpm °
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
## 2015-04-13 NOTE — Progress Notes (Signed)
Orthopedic Tech Progress Note Patient Details:  Mark Nelson [birth year] [id number]  Patient ID: Mark Nelson, male   DOB: [birth year], 24 y.o.   MRN: [id number]   [name] 04/13/2015, 12:21 PMCut down splint so Physical therapist can make custom resting hand splint.
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
## 2015-04-13 NOTE — Progress Notes (Signed)
Mark MusterMarquis L Nelson discharged home per MD order. Discharge instructions reviewed and discussed with patient. All questions and concerns answered. Copy of instructions and scripts given to patient. IV removed.  Patient escorted to car by staff in a wheelchair. No distress noted upon discharge.   Mark FireScott, Mark Nelson 04/13/2015 3:40 PM
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
## 2015-04-13 NOTE — Progress Notes (Signed)
Orthopedic Tech Progress Note Patient Details:  Mark Nelson [birth year] [id number]  Ortho Devices Type of Ortho Device: Ace wrap Ortho Device/Splint Location: kuzman sling RUE Ortho Device/Splint Interventions: Ordered, Application   Mark Nelson 04/13/2015, 12:20 PM
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
## 2015-04-13 NOTE — Discharge Instructions (Signed)
-  may cover wounds with a band aid -shower daily and cleanse wounds with mild soap and water  You may not bear weight to your right arm.

## 2015-04-19 ENCOUNTER — Encounter: Payer: Self-pay | Admitting: Family

## 2015-04-20 ENCOUNTER — Ambulatory Visit (INDEPENDENT_AMBULATORY_CARE_PROVIDER_SITE_OTHER): Payer: Self-pay | Admitting: Family

## 2015-04-20 ENCOUNTER — Encounter: Payer: Self-pay | Admitting: Family

## 2015-04-20 VITALS — BP 133/85 | HR 107 | Temp 97.5°F | Ht 71.0 in | Wt 180.0 lb

## 2015-04-20 DIAGNOSIS — S45101S Unspecified injury of brachial artery, right side, sequela: Secondary | ICD-10-CM
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
## 2015-04-21 ENCOUNTER — Telehealth (HOSPITAL_COMMUNITY): Payer: Self-pay | Admitting: Orthopedic Surgery

## 2015-04-21 NOTE — Telephone Encounter (Signed)
LM to call back.

## 2015-04-23 ENCOUNTER — Telehealth (HOSPITAL_COMMUNITY): Payer: Self-pay | Admitting: Orthopedic Surgery

## 2015-04-23 NOTE — Telephone Encounter (Signed)
Rescheduled appt for next Wednesday

## 2015-05-13 ENCOUNTER — Encounter: Payer: Self-pay | Admitting: Vascular Surgery

## 2015-05-21 ENCOUNTER — Ambulatory Visit: Payer: Self-pay | Admitting: Vascular Surgery

## 2015-05-21 ENCOUNTER — Telehealth (HOSPITAL_COMMUNITY): Payer: Self-pay | Admitting: Orthopedic Surgery

## 2015-05-21 NOTE — Telephone Encounter (Signed)
Was not able to get a hold of his mom, but scheduled him an appointment for 05/26/15 at 3:00pm, told him to arrive by 2:30pm to check in.

## 2015-05-24 ENCOUNTER — Encounter: Payer: Self-pay | Admitting: Family

## 2015-05-24 ENCOUNTER — Ambulatory Visit (INDEPENDENT_AMBULATORY_CARE_PROVIDER_SITE_OTHER): Payer: Self-pay | Admitting: Family

## 2015-05-24 VITALS — BP 112/70 | HR 92 | Temp 98.4°F | Resp 16 | Ht 70.0 in

## 2015-05-24 DIAGNOSIS — Z720 Tobacco use: Secondary | ICD-10-CM

## 2015-05-24 DIAGNOSIS — F17200 Nicotine dependence, unspecified, uncomplicated: Secondary | ICD-10-CM

## 2015-05-24 DIAGNOSIS — S45101S Unspecified injury of brachial artery, right side, sequela: Secondary | ICD-10-CM

## 2015-05-24 NOTE — Patient Instructions (Signed)
Smoking Cessation, Tips for Success If you are ready to quit smoking, congratulations! You have chosen to help yourself be healthier. Cigarettes bring nicotine, tar, carbon monoxide, and other irritants into your body. Your lungs, heart, and blood vessels will be able to work better without these poisons. There are many different ways to quit smoking. Nicotine gum, nicotine patches, a nicotine inhaler, or nicotine nasal spray can help with physical craving. Hypnosis, support groups, and medicines help break the habit of smoking. WHAT THINGS CAN I DO TO MAKE QUITTING EASIER?  Here are some tips to help you quit for good:  Pick a date when you will quit smoking completely. Tell all of your friends and family about your plan to quit on that date.  Do not try to slowly cut down on the number of cigarettes you are smoking. Pick a quit date and quit smoking completely starting on that day.  Throw away all cigarettes.   Clean and remove all ashtrays from your home, work, and car.  On a card, write down your reasons for quitting. Carry the card with you and read it when you get the urge to smoke.  Cleanse your body of nicotine. Drink enough water and fluids to keep your urine clear or pale yellow. Do this after quitting to flush the nicotine from your body.  Learn to predict your moods. Do not let a bad situation be your excuse to have a cigarette. Some situations in your life might tempt you into wanting a cigarette.  Never have "just one" cigarette. It leads to wanting another and another. Remind yourself of your decision to quit.  Change habits associated with smoking. If you smoked while driving or when feeling stressed, try other activities to replace smoking. Stand up when drinking your coffee. Brush your teeth after eating. Sit in a different chair when you read the paper. Avoid alcohol while trying to quit, and try to drink fewer caffeinated beverages. Alcohol and caffeine may urge you to  smoke.  Avoid foods and drinks that can trigger a desire to smoke, such as sugary or spicy foods and alcohol.  Ask people who smoke not to smoke around you.  Have something planned to do right after eating or having a cup of coffee. For example, plan to take a walk or exercise.  Try a relaxation exercise to calm you down and decrease your stress. Remember, you may be tense and nervous for the first 2 weeks after you quit, but this will pass.  Find new activities to keep your hands busy. Play with a pen, coin, or rubber band. Doodle or draw things on paper.  Brush your teeth right after eating. This will help cut down on the craving for the taste of tobacco after meals. You can also try mouthwash.   Use oral substitutes in place of cigarettes. Try using lemon drops, carrots, cinnamon sticks, or chewing gum. Keep them handy so they are available when you have the urge to smoke.  When you have the urge to smoke, try deep breathing.  Designate your home as a nonsmoking area.  If you are a heavy smoker, ask your health care provider about a prescription for nicotine chewing gum. It can ease your withdrawal from nicotine.  Reward yourself. Set aside the cigarette money you save and buy yourself something nice.  Look for support from others. Join a support group or smoking cessation program. Ask someone at home or at work to help you with your plan   to quit smoking.  Always ask yourself, "Do I need this cigarette or is this just a reflex?" Tell yourself, "Today, I choose not to smoke," or "I do not want to smoke." You are reminding yourself of your decision to quit.  Do not replace cigarette smoking with electronic cigarettes (commonly called e-cigarettes). The safety of e-cigarettes is unknown, and some may contain harmful chemicals.  If you relapse, do not give up! Plan ahead and think about what you will do the next time you get the urge to smoke. HOW WILL I FEEL WHEN I QUIT SMOKING? You  may have symptoms of withdrawal because your body is used to nicotine (the addictive substance in cigarettes). You may crave cigarettes, be irritable, feel very hungry, cough often, get headaches, or have difficulty concentrating. The withdrawal symptoms are only temporary. They are strongest when you first quit but will go away within 10-14 days. When withdrawal symptoms occur, stay in control. Think about your reasons for quitting. Remind yourself that these are signs that your body is healing and getting used to being without cigarettes. Remember that withdrawal symptoms are easier to treat than the major diseases that smoking can cause.  Even after the withdrawal is over, expect periodic urges to smoke. However, these cravings are generally short lived and will go away whether you smoke or not. Do not smoke! WHAT RESOURCES ARE AVAILABLE TO HELP ME QUIT SMOKING? Your health care provider can direct you to community resources or hospitals for support, which may include:  Group support.  Education.  Hypnosis.  Therapy.   This information is not intended to replace advice given to you by your health care provider. Make sure you discuss any questions you have with your health care provider.   Document Released: 01/28/2004 Document Revised: 05/22/2014 Document Reviewed: 10/17/2012 Elsevier Interactive Patient Education 2016 [employer]    Steps to Quit Smoking  Smoking tobacco can be harmful to your health and can affect almost every organ in your body. Smoking puts you, and those around you, at risk for developing many serious chronic diseases. Quitting smoking is difficult, but it is one of the best things that you can do for your health. It is never too late to quit. WHAT ARE THE BENEFITS OF QUITTING SMOKING? When you quit smoking, you lower your risk of developing serious diseases and conditions, such as:  Lung cancer or lung disease, such as COPD.  Heart disease.  Stroke.  Heart  attack.  Infertility.  Osteoporosis and bone fractures. Additionally, symptoms such as coughing, wheezing, and shortness of breath may get better when you quit. You may also find that you get sick less often because your body is stronger at fighting off colds and infections. If you are pregnant, quitting smoking can help to reduce your chances of having a baby of low birth weight. HOW DO I GET READY TO QUIT? When you decide to quit smoking, create a plan to make sure that you are successful. Before you quit:  Pick a date to quit. Set a date within the next two weeks to give you time to prepare.  Write down the reasons why you are quitting. Keep this list in places where you will see it often, such as on your bathroom mirror or in your car or wallet.  Identify the people, places, things, and activities that make you want to smoke (triggers) and avoid them. Make sure to take these actions:  Throw away all cigarettes at home, at work,   and in your car.  Throw away smoking accessories, such as ashtrays and lighters.  Clean your car and make sure to empty the ashtray.  Clean your home, including curtains and carpets.  Tell your family, friends, and coworkers that you are quitting. Support from your loved ones can make quitting easier.  Talk with your health care provider about your options for quitting smoking.  Find out what treatment options are covered by your health insurance. WHAT STRATEGIES CAN I USE TO QUIT SMOKING?  Talk with your healthcare provider about different strategies to quit smoking. Some strategies include:  Quitting smoking altogether instead of gradually lessening how much you smoke over a period of time. Research shows that quitting "cold turkey" is more successful than gradually quitting.  Attending in-person counseling to help you build problem-solving skills. You are more likely to have success in quitting if you attend several counseling sessions. Even short  sessions of 10 minutes can be effective.  Finding resources and support systems that can help you to quit smoking and remain smoke-free after you quit. These resources are most helpful when you use them often. They can include:  Online chats with a counselor.  Telephone quitlines.  Printed self-help materials.  Support groups or group counseling.  Text messaging programs.  Mobile phone applications.  Taking medicines to help you quit smoking. (If you are pregnant or breastfeeding, talk with your health care provider first.) Some medicines contain nicotine and some do not. Both types of medicines help with cravings, but the medicines that include nicotine help to relieve withdrawal symptoms. Your health care provider may recommend:  Nicotine patches, gum, or lozenges.  Nicotine inhalers or sprays.  Non-nicotine medicine that is taken by mouth. Talk with your health care provider about combining strategies, such as taking medicines while you are also receiving in-person counseling. Using these two strategies together makes you more likely to succeed in quitting than if you used either strategy on its own. If you are pregnant or breastfeeding, talk with your health care provider about finding counseling or other support strategies to quit smoking. Do not take medicine to help you quit smoking unless told to do so by your health care provider. WHAT THINGS CAN I DO TO MAKE IT EASIER TO QUIT? Quitting smoking might feel overwhelming at first, but there is a lot that you can do to make it easier. Take these important actions:  Reach out to your family and friends and ask that they support and encourage you during this time. Call telephone quitlines, reach out to support groups, or work with a counselor for support.  Ask people who smoke to avoid smoking around you.  Avoid places that trigger you to smoke, such as bars, parties, or smoke-break areas at work.  Spend time around people who do  not smoke.  Lessen stress in your life, because stress can be a smoking trigger for some people. To lessen stress, try:  Exercising regularly.  Deep-breathing exercises.  Yoga.  Meditating.  Performing a body scan. This involves closing your eyes, scanning your body from head to toe, and noticing which parts of your body are particularly tense. Purposefully relax the muscles in those areas.  Download or purchase mobile phone or tablet apps (applications) that can help you stick to your quit plan by providing reminders, tips, and encouragement. There are many free apps, such as QuitGuide from the CDC (Centers for Disease Control and Prevention). You can find other support for quitting smoking (smoking   cessation) through smokefree.gov and other websites. HOW WILL I FEEL WHEN I QUIT SMOKING? Within the first 24 hours of quitting smoking, you may start to feel some withdrawal symptoms. These symptoms are usually most noticeable 2-3 days after quitting, but they usually do not last beyond 2-3 weeks. Changes or symptoms that you might experience include:  Mood swings.  Restlessness, anxiety, or irritation.  Difficulty concentrating.  Dizziness.  Strong cravings for sugary foods in addition to nicotine.  Mild weight gain.  Constipation.  Nausea.  Coughing or a sore throat.  Changes in how your medicines work in your body.  A depressed mood.  Difficulty sleeping (insomnia). After the first 2-3 weeks of quitting, you may start to notice more positive results, such as:  Improved sense of smell and taste.  Decreased coughing and sore throat.  Slower heart rate.  Lower blood pressure.  Clearer skin.  The ability to breathe more easily.  Fewer sick days. Quitting smoking is very challenging for most people. Do not get discouraged if you are not successful the first time. Some people need to make many attempts to quit before they achieve long-term success. Do your best to  stick to your quit plan, and talk with your health care provider if you have any questions or concerns.   This information is not intended to replace advice given to you by your health care provider. Make sure you discuss any questions you have with your health care provider.   Document Released: 04/25/2001 Document Revised: 09/15/2014 Document Reviewed: 09/15/2014 Elsevier Interactive Patient Education 2016 Elsevier Inc.  
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
## 2015-06-03 ENCOUNTER — Ambulatory Visit: Payer: No Typology Code available for payment source | Attending: Orthopaedic Surgery | Admitting: Occupational Therapy

## 2015-07-16 ENCOUNTER — Emergency Department (HOSPITAL_COMMUNITY)
Admission: EM | Admit: 2015-07-16 | Discharge: 2015-07-17 | Attending: Emergency Medicine | Admitting: Emergency Medicine

## 2015-07-16 ENCOUNTER — Encounter (HOSPITAL_COMMUNITY): Payer: Self-pay | Admitting: *Deleted

## 2015-07-16 ENCOUNTER — Emergency Department (HOSPITAL_COMMUNITY)

## 2015-07-16 DIAGNOSIS — F17210 Nicotine dependence, cigarettes, uncomplicated: Secondary | ICD-10-CM | POA: Diagnosis not present

## 2015-07-16 DIAGNOSIS — S4991XA Unspecified injury of right shoulder and upper arm, initial encounter: Secondary | ICD-10-CM | POA: Diagnosis present

## 2015-07-16 DIAGNOSIS — R200 Anesthesia of skin: Secondary | ICD-10-CM | POA: Insufficient documentation

## 2015-07-16 DIAGNOSIS — Y9289 Other specified places as the place of occurrence of the external cause: Secondary | ICD-10-CM | POA: Diagnosis not present

## 2015-07-16 DIAGNOSIS — Y9389 Activity, other specified: Secondary | ICD-10-CM | POA: Insufficient documentation

## 2015-07-16 DIAGNOSIS — S42301A Unspecified fracture of shaft of humerus, right arm, initial encounter for closed fracture: Secondary | ICD-10-CM

## 2015-07-16 DIAGNOSIS — Y998 Other external cause status: Secondary | ICD-10-CM | POA: Insufficient documentation

## 2015-07-16 DIAGNOSIS — S42302A Unspecified fracture of shaft of humerus, left arm, initial encounter for closed fracture: Secondary | ICD-10-CM | POA: Diagnosis not present

## 2015-07-16 DIAGNOSIS — Z79899 Other long term (current) drug therapy: Secondary | ICD-10-CM | POA: Diagnosis not present
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
## 2015-07-17 MED ORDER — ACETAMINOPHEN-CODEINE #3 300-30 MG PO TABS: 1.0000 | ORAL_TABLET | Freq: Four times a day (QID) | ORAL | Status: AC | PRN

## 2015-07-17 MED ORDER — HYDROCODONE-ACETAMINOPHEN 5-325 MG PO TABS
1.0000 | ORAL_TABLET | Freq: Once | ORAL | Status: AC
  Administered 2015-07-17: 1 via ORAL
  Filled 2015-07-17: qty 1

## 2015-07-17 NOTE — Progress Notes (Signed)
Orthopedic Tech Progress Note Patient Details:  Mark Nelson 06/06/1990 [id number]  Ortho Devices Type of Ortho Device: Coapt, Post (long arm) splint Ortho Device/Splint Location: rue Ortho Device/Splint Interventions: Ordered, Application   Trinna PostMartinez, Joaquim Tolen J 07/17/2015, 1:01 AM
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
## 2015-07-17 NOTE — ED Notes (Signed)
Dr. Jacubowitz at bedside 

## 2015-07-17 NOTE — Discharge Instructions (Signed)
First thing Monday morning, call the orthopedic physician listed to schedule your follow-up appointment. Let them know you were seen in the emergency department and told to schedule the next available appointment. It is very important that you make it to this appointment. Read the information below.  You may return to the Emergency Department at any time for worsening condition or any new symptoms that concern you.  If you develop uncontrolled pain, weakness or numbness of the extremity, severe discoloration of the skin, or you are unable to move your arm, return to the ER for a recheck.       Humerus Fracture Treated With Immobilization The humerus is the large bone in your upper arm. You have a broken (fractured) humerus. These fractures are easily diagnosed with X-rays. TREATMENT  Simple fractures which will heal without disability are treated with simple immobilization. Immobilization means you will wear a cast, splint, or sling. You have a fracture which will do well with immobilization. The fracture will heal well simply by being held in a good position until it is stable enough to begin range of motion exercises. Do not take part in activities which would further injure your arm.  HOME CARE INSTRUCTIONS   Put ice on the injured area.  Put ice in a plastic bag.  Place a towel between your skin and the bag.  Leave the ice on for 15-20 minutes, 03-04 times a day.  If you have a cast:  Do not scratch the skin under the cast using sharp or pointed objects.  Check the skin around the cast every day. You may put lotion on any red or sore areas.  Keep your cast dry and clean.  If you have a splint:  Wear the splint as directed.  Keep your splint dry and clean.  You may loosen the elastic around the splint if your fingers become numb, tingle, or turn cold or blue.  If you have a sling:  Wear the sling as directed.  Do not put pressure on any part of your cast or splint until it is  fully hardened.  Your cast or splint can be protected during bathing with a plastic bag. Do not lower the cast or splint into water.  Only take over-the-counter or prescription medicines for pain, discomfort, or fever as directed by your caregiver.  Do range of motion exercises as instructed by your caregiver.  Follow up as directed by your caregiver. This is very important in order to avoid permanent injury or disability and chronic pain. SEEK IMMEDIATE MEDICAL CARE IF:   Your skin or nails in the injured arm turn blue or gray.  Your arm feels cold or numb.  You develop severe pain in the injured arm.  You are having problems with the medicines you were given. MAKE SURE YOU:   Understand these instructions.  Will watch your condition.  Will get help right away if you are not doing well or get worse.   This information is not intended to replace advice given to you by your health care provider. Make sure you discuss any questions you have with your health care provider.   Document Released: 08/07/2000 Document Revised: 05/22/2014 Document Reviewed: 09/23/2014 Elsevier Interactive Patient Education [employer]2016 [employer]
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
## 2015-07-17 NOTE — ED Notes (Signed)
See PAs notes for secondary assessment.  

## 2016-05-26 ENCOUNTER — Other Ambulatory Visit (HOSPITAL_COMMUNITY): Payer: Self-pay

## 2016-05-26 ENCOUNTER — Ambulatory Visit: Payer: Self-pay | Admitting: Vascular Surgery

## 2016-06-02 ENCOUNTER — Ambulatory Visit: Payer: Self-pay | Admitting: Vascular Surgery

## 2016-06-02 ENCOUNTER — Other Ambulatory Visit (HOSPITAL_COMMUNITY): Payer: Self-pay

## 2016-06-02 ENCOUNTER — Ambulatory Visit: Payer: Self-pay

## 2016-07-31 DIAGNOSIS — Z0279 Encounter for issue of other medical certificate: Secondary | ICD-10-CM | POA: Diagnosis not present
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
## 2017-07-13 DEATH — deceased
